# Patient Record
Sex: Male | Born: 1960 | Race: White | Hispanic: No | Marital: Married | Smoking: Former smoker
Health system: Southern US, Community
[De-identification: ages and names within clinical notes are randomized; demographics above are authoritative.]

## PROBLEM LIST (undated history)

## (undated) DIAGNOSIS — D126 Benign neoplasm of colon, unspecified: Secondary | ICD-10-CM

## (undated) DIAGNOSIS — N400 Enlarged prostate without lower urinary tract symptoms: Secondary | ICD-10-CM

## (undated) DIAGNOSIS — K5790 Diverticulosis of intestine, part unspecified, without perforation or abscess without bleeding: Secondary | ICD-10-CM

## (undated) DIAGNOSIS — N200 Calculus of kidney: Secondary | ICD-10-CM

## (undated) DIAGNOSIS — G4733 Obstructive sleep apnea (adult) (pediatric): Secondary | ICD-10-CM

## (undated) HISTORY — PX: SIGMOIDOSCOPY: SUR1295

## (undated) HISTORY — DX: Obstructive sleep apnea (adult) (pediatric): G47.33

## (undated) HISTORY — DX: Benign prostatic hyperplasia without lower urinary tract symptoms: N40.0

## (undated) HISTORY — DX: Diverticulosis of intestine, part unspecified, without perforation or abscess without bleeding: K57.90

## (undated) HISTORY — DX: Calculus of kidney: N20.0

## (undated) HISTORY — PX: TONSILLECTOMY AND ADENOIDECTOMY: SUR1326

## (undated) HISTORY — DX: Benign neoplasm of colon, unspecified: D12.6

---
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
## 1993-08-15 HISTORY — PX: UMBILICAL HERNIA REPAIR: SHX196

## 2011-06-10 ENCOUNTER — Emergency Department (HOSPITAL_COMMUNITY)
Admission: EM | Admit: 2011-06-10 | Discharge: 2011-06-10 | Disposition: A | Discharge status: Home / Self Care | Payer: 59 | Attending: Emergency Medicine | Admitting: Emergency Medicine

## 2011-06-10 ENCOUNTER — Emergency Department (HOSPITAL_COMMUNITY): Payer: 59

## 2011-06-10 DIAGNOSIS — Z79899 Other long term (current) drug therapy: Secondary | ICD-10-CM | POA: Insufficient documentation

## 2011-06-10 DIAGNOSIS — N201 Calculus of ureter: Secondary | ICD-10-CM | POA: Insufficient documentation

## 2011-06-10 DIAGNOSIS — N1330 Unspecified hydronephrosis: Secondary | ICD-10-CM | POA: Insufficient documentation

## 2011-06-10 DIAGNOSIS — R110 Nausea: Secondary | ICD-10-CM | POA: Insufficient documentation

## 2011-06-10 DIAGNOSIS — R109 Unspecified abdominal pain: Secondary | ICD-10-CM | POA: Insufficient documentation

## 2011-06-10 LAB — POCT I-STAT, CHEM 8
Chloride: 103 mEq/L (ref 96–112)
HCT: 46 % (ref 39.0–52.0)
Hemoglobin: 15.6 g/dL (ref 13.0–17.0)
Potassium: 4 mEq/L (ref 3.5–5.1)
Sodium: 140 mEq/L (ref 135–145)

## 2011-06-10 LAB — URINE MICROSCOPIC-ADD ON

## 2011-06-10 LAB — URINALYSIS, ROUTINE W REFLEX MICROSCOPIC
Leukocytes, UA: NEGATIVE
Nitrite: NEGATIVE
Specific Gravity, Urine: 1.016 (ref 1.005–1.030)
Urobilinogen, UA: 0.2 mg/dL (ref 0.0–1.0)
pH: 6.5 (ref 5.0–8.0)

## 2011-06-11 ENCOUNTER — Emergency Department (HOSPITAL_COMMUNITY)
Admission: EM | Admit: 2011-06-11 | Discharge: 2011-06-11 | Disposition: A | Discharge status: Home / Self Care | Payer: 59 | Attending: Emergency Medicine | Admitting: Emergency Medicine

## 2011-06-11 DIAGNOSIS — N200 Calculus of kidney: Secondary | ICD-10-CM | POA: Insufficient documentation

## 2011-06-11 DIAGNOSIS — R109 Unspecified abdominal pain: Secondary | ICD-10-CM | POA: Insufficient documentation

## 2011-06-11 DIAGNOSIS — R10819 Abdominal tenderness, unspecified site: Secondary | ICD-10-CM | POA: Insufficient documentation

## 2011-06-11 DIAGNOSIS — R112 Nausea with vomiting, unspecified: Secondary | ICD-10-CM | POA: Insufficient documentation

## 2011-06-11 LAB — DIFFERENTIAL
Basophils Relative: 0 % (ref 0–1)
Eosinophils Absolute: 0 10*3/uL (ref 0.0–0.7)
Lymphs Abs: 1.1 10*3/uL (ref 0.7–4.0)
Monocytes Absolute: 1.8 10*3/uL — ABNORMAL HIGH (ref 0.1–1.0)
Monocytes Relative: 11 % (ref 3–12)

## 2011-06-11 LAB — CBC
Hemoglobin: 15.8 g/dL (ref 13.0–17.0)
MCH: 28.7 pg (ref 26.0–34.0)
MCHC: 35 g/dL (ref 30.0–36.0)
MCV: 82 fL (ref 78.0–100.0)
Platelets: 244 10*3/uL (ref 150–400)
RBC: 5.5 MIL/uL (ref 4.22–5.81)

## 2011-06-11 LAB — BASIC METABOLIC PANEL
CO2: 22 mEq/L (ref 19–32)
Calcium: 9.3 mg/dL (ref 8.4–10.5)
Creatinine, Ser: 1.67 mg/dL — ABNORMAL HIGH (ref 0.50–1.35)
GFR calc non Af Amer: 46 mL/min — ABNORMAL LOW (ref >90)
Glucose, Bld: 108 mg/dL — ABNORMAL HIGH (ref 70–99)
Sodium: 134 mEq/L — ABNORMAL LOW (ref 135–145)

## 2011-06-11 LAB — URINE CULTURE
Colony Count: NO GROWTH
Culture  Setup Time: 201210260519

## 2011-06-11 LAB — URINALYSIS, ROUTINE W REFLEX MICROSCOPIC
Glucose, UA: NEGATIVE mg/dL
Ketones, ur: 15 mg/dL — AB
Protein, ur: 30 mg/dL — AB
Urobilinogen, UA: 0.2 mg/dL (ref 0.0–1.0)

## 2011-06-11 LAB — URINE MICROSCOPIC-ADD ON

## 2011-09-14 DIAGNOSIS — N200 Calculus of kidney: Secondary | ICD-10-CM | POA: Insufficient documentation

## 2011-09-14 DIAGNOSIS — N400 Enlarged prostate without lower urinary tract symptoms: Secondary | ICD-10-CM | POA: Insufficient documentation

## 2012-03-07 ENCOUNTER — Encounter: Payer: Self-pay | Admitting: Internal Medicine

## 2012-04-04 ENCOUNTER — Ambulatory Visit (AMBULATORY_SURGERY_CENTER): Payer: 59 | Admitting: *Deleted

## 2012-04-04 VITALS — Ht 74.0 in | Wt 266.3 lb

## 2012-04-04 DIAGNOSIS — Z1211 Encounter for screening for malignant neoplasm of colon: Secondary | ICD-10-CM

## 2012-04-04 MED ORDER — NA SULFATE-K SULFATE-MG SULF 17.5-3.13-1.6 GM/177ML PO SOLN: 1.0000 | Freq: Once | ORAL | Status: DC

## 2012-04-04 NOTE — Progress Notes (Signed)
Pt stated at his pre visit that with prior anesthesia his " insides froze up" and that he had a lot of GI pain/problems after the procedures. Pt stated that he has 2 umbilical hernia repairs and that they were laproscopic in nature.  Pt stated he has lots of gas and that his insides just didn't work for several days after the procedures. He was concerned about the sedation he would receive here with his colonoscopy. Pt informed and instructed on deep sedation with the colonoscopy he would have here, was told not general anesthesia and he would be able to discuss this concern with the CRNA on the day of the procedure. Pt stated after discussion he was comfortable with the deep sedation he would receive. ewm

## 2012-04-06 ENCOUNTER — Encounter: Payer: Self-pay | Admitting: Internal Medicine

## 2012-04-18 ENCOUNTER — Ambulatory Visit (AMBULATORY_SURGERY_CENTER): Payer: 59 | Admitting: Internal Medicine

## 2012-04-18 ENCOUNTER — Encounter: Payer: Self-pay | Admitting: Internal Medicine

## 2012-04-18 VITALS — BP 112/75 | HR 69 | Temp 97.2°F | Resp 18 | Ht 74.0 in | Wt 266.0 lb

## 2012-04-18 DIAGNOSIS — D126 Benign neoplasm of colon, unspecified: Secondary | ICD-10-CM

## 2012-04-18 DIAGNOSIS — Z1211 Encounter for screening for malignant neoplasm of colon: Secondary | ICD-10-CM

## 2012-04-18 DIAGNOSIS — D1330 Benign neoplasm of unspecified part of small intestine: Secondary | ICD-10-CM

## 2012-04-18 MED ORDER — SODIUM CHLORIDE 0.9 % IV SOLN: 500.0000 mL | INTRAVENOUS | Status: DC

## 2012-04-18 NOTE — Patient Instructions (Addendum)

## 2012-04-18 NOTE — Progress Notes (Signed)
Patient did not experience any of the following events: a burn prior to discharge; a fall within the facility; wrong site/side/patient/procedure/implant event; or a hospital transfer or hospital admission upon discharge from the facility. (G8907) Patient did not have preoperative order for IV antibiotic SSI prophylaxis. (G8918)  

## 2012-04-18 NOTE — Op Note (Signed)
[facility] [address] [place], [address]   COLONOSCOPY PROCEDURE REPORT  PATIENT: Welton, Bord  MR#: [id number] BIRTHDATE: 1961/07/08 , 51  yrs. old GENDER: Male ENDOSCOPIST: Beverley Fiedler, MD REFERRED BY: PROCEDURE DATE:  04/18/2012 PROCEDURE:   Colonoscopy with biopsy, Colonoscopy with cold biopsy polypectomy, and Colonoscopy with snare polypectomy ASA CLASS:   Class II INDICATIONS:average risk screening and first colonoscopy. MEDICATIONS: MAC sedation, administered by CRNA and propofol (Diprivan) 300mg  IV  DESCRIPTION OF PROCEDURE:   After the risks benefits and alternatives of the procedure were thoroughly explained, informed consent was obtained.  A digital rectal exam revealed no rectal mass.   The LB CF-H180AL [id number]  endoscope was introduced through the anus and advanced to the terminal ileum which was intubated for a short distance. No adverse events experienced.   The quality of the prep was Suprep good  The instrument was then slowly withdrawn as the colon was fully examined.      COLON FINDINGS: The mucosa appeared normal in the terminal ileum. A possible polyp was found at the ileocecal valve.  This was located behind the valve.  Multiple biopsies were performed to rule out adenoma.   Two sessile polyps ranging between 3-20mm in size were found in the transverse colon.  A polypectomy was performed with cold forceps.  The resection was complete and the polyp tissue was completely retrieved.   A semi-pedunculated polyp measuring 5 mm in size was found in the transverse colon.  A polypectomy was performed with a cold snare.  The resection was complete and the polyp tissue was completely retrieved.   Three sessile polyps ranging between 3-29mm in size were found in the rectosigmoid colon. A polypectomy was performed with cold forceps.  The resection was complete and the polyp tissue was completely retrieved.   Mild diverticulosis was noted in  the sigmoid colon.  Retroflexed views revealed internal hemorrhoids. The time to cecum=1 minutes 57 seconds.  Withdrawal time=17 minutes 36 seconds.  The scope was withdrawn and the procedure completed. COMPLICATIONS: There were no complications.  ENDOSCOPIC IMPRESSION: 1.   Normal mucosa in the terminal ileum 2.   Sessile polyp was found at the ileocecal valve; multiple biopsies were performed 3.   Two sessile polyps ranging between 3-53mm in size were found in the transverse colon; polypectomy was performed with cold forceps 4.   Semi-pedunculated polyp measuring 5 mm in size was found in the transverse colon; polypectomy was performed with a cold snare 5.   Three sessile polyps ranging between 3-73mm in size were found in the rectosigmoid colon; polypectomy was performed with cold forceps 6.   Mild diverticulosis was noted in the sigmoid colon  RECOMMENDATIONS: 1.  Hold aspirin, aspirin products, and anti-inflammatory medication for 1 week. 2.  await pathology results 3.  High fiber diet 4.  If the polyps removed today are proven to be adenomatous (pre-cancerous) polyps, you will need a colonoscopy in 3 years. Otherwise you should continue to follow colorectal cancer screening guidelines for "routine risk" patients with a colonoscopy in 10 years.  You will receive a letter within 1-2 weeks with the results of your biopsy as well as final recommendations.  Please call my office if you have not received a letter after 3 weeks.   eSigned:  Beverley Fiedler, MD 04/18/2012 9:18 AM   cc: The Patient   PATIENT NAME:  Edi, Gorniak MR#: [id number]

## 2012-04-19 ENCOUNTER — Telehealth: Payer: Self-pay | Admitting: *Deleted

## 2012-04-19 NOTE — Telephone Encounter (Signed)
  Follow up Call-  Call back number 04/18/2012  Post procedure Call Back phone  # [phone]  Permission to leave phone message Yes     Patient questions:  Do you have a fever, pain , or abdominal swelling? no Pain Score  0 *  Have you tolerated food without any problems? yes  Have you been able to return to your normal activities? yes  Do you have any questions about your discharge instructions: Diet   no Medications  no Follow up visit  no  Do you have questions or concerns about your Care? no  Actions: * If pain score is 4 or above: No action needed, pain <4.

## 2012-04-23 ENCOUNTER — Encounter: Payer: Self-pay | Admitting: Internal Medicine

## 2013-07-02 ENCOUNTER — Encounter: Payer: Self-pay | Admitting: Family Medicine

## 2013-07-02 ENCOUNTER — Ambulatory Visit (INDEPENDENT_AMBULATORY_CARE_PROVIDER_SITE_OTHER): Payer: 59 | Admitting: Family Medicine

## 2013-07-02 VITALS — BP 110/78 | HR 100 | Temp 98.1°F | Resp 20 | Ht 72.0 in | Wt 288.0 lb

## 2013-07-02 DIAGNOSIS — E669 Obesity, unspecified: Secondary | ICD-10-CM

## 2013-07-02 DIAGNOSIS — R000 Tachycardia, unspecified: Secondary | ICD-10-CM

## 2013-07-02 DIAGNOSIS — Z23 Encounter for immunization: Secondary | ICD-10-CM

## 2013-07-02 DIAGNOSIS — J189 Pneumonia, unspecified organism: Secondary | ICD-10-CM

## 2013-07-02 DIAGNOSIS — Z0000 Encounter for general adult medical examination without abnormal findings: Secondary | ICD-10-CM

## 2013-07-02 NOTE — Assessment & Plan Note (Signed)
He has a mild resting tachycardia. He has been using a significant amount of albuterol which may be contributing as well his recent prednisone use. His EKG was reassuring.

## 2013-07-02 NOTE — Patient Instructions (Signed)
Chest xray to be done Come back for fasting labs  Flu and Pneumonia vaccine given Next colonoscopy in 2016  Work on weight loss I recommend eye visit once a year I recommend dental visit every 6 months Goal is to  Exercise 30 minutes 5 days a week F/U 1 year

## 2013-07-02 NOTE — Progress Notes (Signed)
  Subjective:    Patient ID: Cory Reynolds, male    DOB: Jun 23, 1961, 52 y.o.   MRN: [id number]  HPI  Patient here to for annual physical exam. He was treated for pneumonia 4 weeks ago in urgent care he was prescribed Levaquin prednisone and albuterol inhaler. He is due for repeat chest x-ray. His cough has improved. He does occasionally use the albuterol. He's had 3 episodes of pneumonia all requiring treatment.  He has history of sleep apnea however he lost 30 pounds does not use his sleep apnea machine in quite some time.  Colonoscopy is up to date he did have some polyps therefore he needs a repeat colonoscopy in 20 16th  His history of BPH and is followed by urology he's currently on medications for BPH as well as recurrent kidney stones. He's had a PSA as well as rectal exam done within the past year.    Review of Systems   GEN- denies fatigue, fever, weight loss,weakness, recent illness HEENT- denies eye drainage, change in vision, nasal discharge, CVS- denies chest pain, palpitations RESP- denies SOB, cough, wheeze ABD- denies N/V, change in stools, abd pain GU- denies dysuria, hematuria, dribbling, incontinence MSK- denies joint pain, muscle aches, injury Neuro- denies headache, dizziness, syncope, seizure activity      Objective:   Physical Exam GEN- NAD, alert and oriented x3 HEENT- PERRL, EOMI, non injected sclera, pink conjunctiva, MMM, oropharynx clear Neck- Supple, no thryomegaly CVS- Mild tachycardia- HR 96, no murmur RESP-CTAB ABD-NABS,soft,NT,ND GU-Deferred EXT- No edema Pulses- Radial, DP- 2+  EKG- NSR, HR 86         Assessment & Plan:   CPE- physical complete. Fasting labs to be done. Flu shot and pneumonia vaccine given secondary to his recurrent pneumonia

## 2013-07-02 NOTE — Assessment & Plan Note (Signed)
Will repeat chest x-ray he will have this done at the [facility] imaging center out in [facility]

## 2013-07-04 ENCOUNTER — Other Ambulatory Visit: Payer: 59

## 2013-07-04 DIAGNOSIS — Z0000 Encounter for general adult medical examination without abnormal findings: Secondary | ICD-10-CM

## 2013-07-04 DIAGNOSIS — E669 Obesity, unspecified: Secondary | ICD-10-CM

## 2013-07-04 LAB — COMPREHENSIVE METABOLIC PANEL
Alkaline Phosphatase: 62 U/L (ref 39–117)
BUN: 22 mg/dL (ref 6–23)
CO2: 26 mEq/L (ref 19–32)
Creat: 1.05 mg/dL (ref 0.50–1.35)
Glucose, Bld: 102 mg/dL — ABNORMAL HIGH (ref 70–99)
Sodium: 139 mEq/L (ref 135–145)
Total Bilirubin: 0.5 mg/dL (ref 0.3–1.2)
Total Protein: 6.1 g/dL (ref 6.0–8.3)

## 2013-07-04 LAB — LIPID PANEL
Cholesterol: 196 mg/dL (ref 0–200)
HDL: 38 mg/dL — ABNORMAL LOW (ref >39)
Total CHOL/HDL Ratio: 5.2 Ratio
VLDL: 26 mg/dL (ref 0–40)

## 2013-07-04 LAB — CBC WITH DIFFERENTIAL/PLATELET
Eosinophils Relative: 6 % — ABNORMAL HIGH (ref 0–5)
Hemoglobin: 14.7 g/dL (ref 13.0–17.0)
Lymphocytes Relative: 22 % (ref 12–46)
Lymphs Abs: 1.1 10*3/uL (ref 0.7–4.0)
MCV: 82.6 fL (ref 78.0–100.0)
Monocytes Relative: 15 % — ABNORMAL HIGH (ref 3–12)
Platelets: 207 10*3/uL (ref 150–400)
RBC: 5.16 MIL/uL (ref 4.22–5.81)
WBC: 5 10*3/uL (ref 4.0–10.5)

## 2013-07-08 ENCOUNTER — Encounter: Payer: Self-pay | Admitting: *Deleted

## 2013-07-18 ENCOUNTER — Encounter: Payer: Self-pay | Admitting: Family Medicine

## 2014-05-28 ENCOUNTER — Ambulatory Visit: Payer: 59 | Admitting: Family Medicine

## 2015-04-13 ENCOUNTER — Encounter: Payer: Self-pay | Admitting: Internal Medicine

## 2015-05-18 ENCOUNTER — Encounter: Payer: Self-pay | Admitting: Internal Medicine

## 2015-07-14 ENCOUNTER — Ambulatory Visit (AMBULATORY_SURGERY_CENTER): Payer: Self-pay

## 2015-07-14 VITALS — Ht 74.0 in | Wt 272.8 lb

## 2015-07-14 DIAGNOSIS — Z86010 Personal history of colonic polyps: Secondary | ICD-10-CM

## 2015-07-14 MED ORDER — NA SULFATE-K SULFATE-MG SULF 17.5-3.13-1.6 GM/177ML PO SOLN: ORAL | Status: DC

## 2015-07-14 NOTE — Progress Notes (Signed)
Per pt, no allergies to soy or egg products.Pt not taking any weight loss meds or using  O2 at home. 

## 2015-07-24 ENCOUNTER — Encounter: Payer: Self-pay | Admitting: Internal Medicine

## 2015-07-24 ENCOUNTER — Ambulatory Visit (AMBULATORY_SURGERY_CENTER): Payer: BLUE CROSS/BLUE SHIELD | Admitting: Internal Medicine

## 2015-07-24 VITALS — BP 131/94 | HR 71 | Temp 97.6°F | Resp 20 | Ht 74.0 in | Wt 272.0 lb

## 2015-07-24 DIAGNOSIS — D122 Benign neoplasm of ascending colon: Secondary | ICD-10-CM | POA: Diagnosis not present

## 2015-07-24 DIAGNOSIS — D120 Benign neoplasm of cecum: Secondary | ICD-10-CM | POA: Diagnosis not present

## 2015-07-24 DIAGNOSIS — Z86010 Personal history of colonic polyps: Secondary | ICD-10-CM

## 2015-07-24 DIAGNOSIS — D128 Benign neoplasm of rectum: Secondary | ICD-10-CM

## 2015-07-24 DIAGNOSIS — D129 Benign neoplasm of anus and anal canal: Secondary | ICD-10-CM

## 2015-07-24 MED ORDER — SODIUM CHLORIDE 0.9 % IV SOLN: 500.0000 mL | INTRAVENOUS | Status: DC

## 2015-07-24 NOTE — Op Note (Signed)
[facility]  [address] [place], [address]   COLONOSCOPY PROCEDURE REPORT  PATIENT: Cory, Reynolds  MR#: [id number] BIRTHDATE: 1961/05/18 , 30  yrs. old GENDER: male ENDOSCOPIST: Jerene Bears, MD PROCEDURE DATE:  07/24/2015 PROCEDURE:   Colonoscopy, surveillance and Colonoscopy with snare polypectomy First Screening Colonoscopy - Avg.  risk and is 50 yrs.  old or older - No.  Prior Negative Screening - Now for repeat screening. N/A  History of Adenoma - Now for follow-up colonoscopy & has been > or = to 3 yrs.  N/A  Polyps removed today? Yes ASA CLASS:   Class II INDICATIONS:Surveillance due to prior colonic neoplasia, PH Colon Adenoma, and last colon 2013. MEDICATIONS: Monitored anesthesia care, Propofol 400 mg IV, and Lidocaine 40 mg IV  DESCRIPTION OF PROCEDURE:   After the risks benefits and alternatives of the procedure were thoroughly explained, informed consent was obtained.  The digital rectal exam revealed no rectal mass.   The LB PFC-H190 [id number]  endoscope was introduced through the anus and advanced to the cecum, which was identified by both the appendix and ileocecal valve. No adverse events experienced. The quality of the prep was good.  (Suprep was used)  The instrument was then slowly withdrawn as the colon was fully examined. Estimated blood loss is zero unless otherwise noted in this procedure report.  Due to processor error pictures were taken but not captured.     COLON FINDINGS: Three sessile polyps ranging between 3-81mm in size were found at the cecum, in the ascending colon, and rectum. Polypectomies were performed with a cold snare.  The resection was complete, the polyp tissue was completely retrieved and sent to histology.   There was mild diverticulosis noted in the left colon. Retroflexed views revealed internal hemorrhoids. The time to cecum = 3.1 Withdrawal time = 15.5   The scope was withdrawn and the procedure  completed.  COMPLICATIONS: There were no immediate complications.  ENDOSCOPIC IMPRESSION: 1.   Three sessile polyps ranging between 3-68mm in size were found at the cecum, in the ascending colon, and rectum; polypectomies were performed with a cold snare 2.   Mild diverticulosis was noted in the left colon  RECOMMENDATIONS: 1.  Await pathology results 2.  High fiber diet 3.  Timing of repeat colonoscopy will be determined by pathology findings. 4.  You will receive a letter within 1-2 weeks with the results of your biopsy as well as final recommendations.  Please call my office if you have not received a letter after 3 weeks.  eSigned:  Jerene Bears, MD 07/24/2015 8:36 AM   cc: [name] MD, the patient

## 2015-07-24 NOTE — Progress Notes (Signed)
Called to room to assist during endoscopic procedure.  Patient ID and intended procedure confirmed with present staff. Received instructions for my participation in the procedure from the performing physician.  

## 2015-07-24 NOTE — Patient Instructions (Signed)
YOU HAD AN ENDOSCOPIC PROCEDURE TODAY AT [facility]:   Refer to the procedure report that was given to you for any specific questions about what was found during the examination.  If the procedure report does not answer your questions, please call your gastroenterologist to clarify.  If you requested that your care partner not be given the details of your procedure findings, then the procedure report has been included in a sealed envelope for you to review at your convenience later.  YOU SHOULD EXPECT: Some feelings of bloating in the abdomen. Passage of more gas than usual.  Walking can help get rid of the air that was put into your GI tract during the procedure and reduce the bloating. If you had a lower endoscopy (such as a colonoscopy or flexible sigmoidoscopy) you may notice spotting of blood in your stool or on the toilet paper. If you underwent a bowel prep for your procedure, you may not have a normal bowel movement for a few days.  Please Note:  You might notice some irritation and congestion in your nose or some drainage.  This is from the oxygen used during your procedure.  There is no need for concern and it should clear up in a day or so.  SYMPTOMS TO REPORT IMMEDIATELY:   Following lower endoscopy (colonoscopy or flexible sigmoidoscopy):  Excessive amounts of blood in the stool  Significant tenderness or worsening of abdominal pains  Swelling of the abdomen that is new, acute  Fever of 100F or higher  For urgent or emergent issues, a gastroenterologist can be reached at any hour by calling [phone].   DIET: Your first meal following the procedure should be a small meal and then it is ok to progress to your normal diet. Heavy or fried foods are harder to digest and may make you feel nauseous or bloated.  Likewise, meals heavy in dairy and vegetables can increase bloating.  Drink plenty of fluids but you should avoid alcoholic beverages for 24  hours.  ACTIVITY:  You should plan to take it easy for the rest of today and you should NOT DRIVE or use heavy machinery until tomorrow (because of the sedation medicines used during the test).    FOLLOW UP: Our staff will call the number listed on your records the next business day following your procedure to check on you and address any questions or concerns that you may have regarding the information given to you following your procedure. If we do not reach you, we will leave a message.  However, if you are feeling well and you are not experiencing any problems, there is no need to return our call.  We will assume that you have returned to your regular daily activities without incident.  If any biopsies were taken you will be contacted by phone or by letter within the next 1-3 weeks.  Please call us at [phone] if you have not heard about the biopsies in 3 weeks.    SIGNATURES/CONFIDENTIALITY: You and/or your care partner have signed paperwork which will be entered into your electronic medical record.  These signatures attest to the fact that that the information above on your After Visit Summary has been reviewed and is understood.  Full responsibility of the confidentiality of this discharge information lies with you and/or your care-partner.  Next colonoscopy determined by pathology results. Please review polyp, diverticulosis, hemorrhoid, and high fiber diet handouts provided.

## 2015-07-24 NOTE — Progress Notes (Signed)
Stable to RR 

## 2015-07-27 ENCOUNTER — Telehealth: Payer: Self-pay | Admitting: *Deleted

## 2015-07-27 NOTE — Telephone Encounter (Signed)
  Follow up Call-  Call back number 07/24/2015  Post procedure Call Back phone  # [phone]  Permission to leave phone message Yes    Pt gone, spoke with wife Patient questions:  Do you have a fever, pain , or abdominal swelling? No. Pain Score  0 *  Have you tolerated food without any problems? Yes.    Have you been able to return to your normal activities? Yes.    Do you have any questions about your discharge instructions: Diet   No. Medications  No. Follow up visit  No.  Do you have questions or concerns about your Care? No.  Actions: * If pain score is 4 or above: No action needed, pain <4.

## 2015-07-28 ENCOUNTER — Encounter: Payer: Self-pay | Admitting: Internal Medicine

## 2016-02-24 ENCOUNTER — Encounter: Payer: Self-pay | Admitting: Family Medicine

## 2016-02-24 ENCOUNTER — Ambulatory Visit (INDEPENDENT_AMBULATORY_CARE_PROVIDER_SITE_OTHER): Payer: BLUE CROSS/BLUE SHIELD | Admitting: Family Medicine

## 2016-02-24 VITALS — BP 139/93 | HR 73 | Ht 74.0 in | Wt 299.0 lb

## 2016-02-24 DIAGNOSIS — N400 Enlarged prostate without lower urinary tract symptoms: Secondary | ICD-10-CM

## 2016-02-24 DIAGNOSIS — N200 Calculus of kidney: Secondary | ICD-10-CM | POA: Diagnosis not present

## 2016-02-24 DIAGNOSIS — G4733 Obstructive sleep apnea (adult) (pediatric): Secondary | ICD-10-CM

## 2016-02-24 DIAGNOSIS — I10 Essential (primary) hypertension: Secondary | ICD-10-CM | POA: Diagnosis not present

## 2016-02-24 DIAGNOSIS — Z23 Encounter for immunization: Secondary | ICD-10-CM

## 2016-02-24 MED ORDER — ZOSTER VACCINE LIVE 19400 UNT/0.65ML ~~LOC~~ SUSR: 0.6500 mL | Freq: Once | SUBCUTANEOUS | Status: DC

## 2016-02-24 NOTE — Patient Instructions (Addendum)
Thank you for coming in today. Get fasting labs soon.  You should hear from [place] [facility] soon.  Your pharmacy should have a shingles vaccine waiting for you.  Return in 6-12 months or sooner if needed.  Resume use of CPAP.    Call or go to the emergency room if you get worse, have trouble breathing, have chest pains, or palpitations.

## 2016-02-24 NOTE — Progress Notes (Signed)
Cory Reynolds is a 55 y.o. male who presents to [facility] today for establish care and discuss obesity, history of BPH and kidney stones, and history of recurrent pneumonia..  Patient has struggled with his weight has an entire adult life. In the past he was able to lose 50 pounds with diet weight loss exercise and phentermine. He's regained the weight since. He continues to try to diet and exercise but really struggles. He is interested in bariatric surgery. He thinks his obesity is causing most of his health complaints including elevated blood pressure and sleep apnea.  Additionally patient has a history of recurrent pneumonia. In the past she's been told that he should have a pneumonia shot but never got it. He cannot also recall the last time he had a tetanus vaccine.   Past Medical History  Diagnosis Date  . BPH (benign prostatic hyperplasia)   . OSA (obstructive sleep apnea)   . Kidney stones     followed by urology   Past Surgical History  Procedure Laterality Date  . Umbilical hernia repair  1995    x2  . Sigmoidoscopy    . Lymph node biopsy  1978    negative  . Tonsillectomy and adenoidectomy     Social History  Substance Use Topics  . Smoking status: Former Smoker    Types: Cigarettes    Quit date: 09/17/1983  . Smokeless tobacco: Never Used  . Alcohol Use: No   family history includes Heart disease in his father. There is no history of Colon cancer, Rectal cancer, or Stomach cancer.  ROS as above: No headache, visual changes, nausea, vomiting, diarrhea, constipation, dizziness, abdominal pain, skin rash, fevers, chills, night sweats, weight loss, swollen lymph nodes, body aches, joint swelling, muscle aches, chest pain, shortness of breath, mood changes, visual or auditory hallucinations.    Medications: Current Outpatient Prescriptions    Medication Sig Dispense Refill  . allopurinol (ZYLOPRIM) 300 MG tablet Take 300 mg by mouth daily.     Marland Kitchen aspirin 81 MG tablet Take 81 mg by mouth daily.    . hydrochlorothiazide (MICROZIDE) 12.5 MG capsule Take 12.5 mg by mouth daily.     . Magnesium 400 MG TABS Take by mouth daily.    . Tamsulosin HCl (FLOMAX) 0.4 MG CAPS 0.4 mg daily.     Marland Kitchen Zoster Vaccine Live, PF, (ZOSTAVAX) 16109 UNT/0.65ML injection Inject 19,400 Units into the skin once. If given in pharmacy fax report to Dr Georgina Snell [phone] 1 each 0   No current facility-administered medications for this visit.   No Known Allergies   Exam:  BP 139/93 mmHg  Pulse 73  Ht 6\' 2"  (1.88 m)  Wt 299 lb (135.626 kg)  BMI 38.37 kg/m2 Gen: Well NAD Morbidly obesity HEENT: EOMI,  MMM normal posterior pharynx Lungs: Normal work of breathing. CTABL Heart: RRR no MRG Abd: NABS, Soft. Nondistended, Nontender Exts: Brisk capillary refill, warm and well perfused.   Tdap and Pneumovax given prior to discharge  No results found for this or any previous visit (from the past 24 hour(s)). No results found.    Assessment and Plan: 55 y.o. male with   Morbid obesity: Patient is a BMI over 25 with obesity related health problems including hypertension and sleep apnea. We had a long discussion about his options. He would like to have referral to bariatric surgery for a evaluation and to learn more about his options.  I think this is entirely reasonable. Referral order.  Hypertension: Patient takes hydrochlorothiazide and continues to have mildly elevated blood pressure. We'll obtain basic fasting labs.  Sleep apnea: Not currently using CPAP machine. Recommend patient use a seat at machine since he is regaining the weight. He will attempt to find a record of his previous sleep study so that we may order more supplies if he needs it.  Vaccines: Tdap and Pneumovax given prior to discharge. Shingles vaccine prescribed pharmacy.  Discussed  warning signs or symptoms. Please see discharge instructions. Patient expresses understanding.

## 2016-03-04 LAB — CBC
HCT: 45.4 % (ref 38.5–50.0)
HEMOGLOBIN: 15.1 g/dL (ref 13.2–17.1)
MCH: 28.1 pg (ref 27.0–33.0)
MCHC: 33.3 g/dL (ref 32.0–36.0)
MCV: 84.5 fL (ref 80.0–100.0)
MPV: 10.3 fL (ref 7.5–12.5)
PLATELETS: 252 10*3/uL (ref 140–400)
RBC: 5.37 MIL/uL (ref 4.20–5.80)
RDW: 14.2 % (ref 11.0–15.0)
WBC: 5.6 10*3/uL (ref 3.8–10.8)

## 2016-03-05 LAB — COMPREHENSIVE METABOLIC PANEL
ALBUMIN: 4 g/dL (ref 3.6–5.1)
ALK PHOS: 56 U/L (ref 40–115)
ALT: 18 U/L (ref 9–46)
AST: 17 U/L (ref 10–35)
BILIRUBIN TOTAL: 0.5 mg/dL (ref 0.2–1.2)
BUN: 23 mg/dL (ref 7–25)
CO2: 26 mmol/L (ref 20–31)
CREATININE: 1.03 mg/dL (ref 0.70–1.33)
Calcium: 9 mg/dL (ref 8.6–10.3)
Chloride: 103 mmol/L (ref 98–110)
Glucose, Bld: 100 mg/dL — ABNORMAL HIGH (ref 65–99)
Potassium: 4.4 mmol/L (ref 3.5–5.3)
SODIUM: 142 mmol/L (ref 135–146)
TOTAL PROTEIN: 6.1 g/dL (ref 6.1–8.1)

## 2016-03-05 LAB — LIPID PANEL
Cholesterol: 176 mg/dL (ref 125–200)
HDL: 39 mg/dL — AB (ref >=40)
LDL CALC: 113 mg/dL (ref <130)
Total CHOL/HDL Ratio: 4.5 Ratio (ref <=5.0)
Triglycerides: 120 mg/dL (ref <150)
VLDL: 24 mg/dL (ref <30)

## 2016-03-05 LAB — VITAMIN D 25 HYDROXY (VIT D DEFICIENCY, FRACTURES): VIT D 25 HYDROXY: 29 ng/mL — AB (ref 30–100)

## 2016-03-05 LAB — HEMOGLOBIN A1C
HEMOGLOBIN A1C: 6 % — AB (ref <5.7)
MEAN PLASMA GLUCOSE: 126 mg/dL

## 2016-03-05 LAB — TESTOSTERONE: TESTOSTERONE: 369 ng/dL (ref 250–827)

## 2016-03-05 LAB — TSH: TSH: 1.09 m[IU]/L (ref 0.40–4.50)

## 2016-03-05 LAB — URIC ACID: URIC ACID, SERUM: 4.5 mg/dL (ref 4.0–8.0)

## 2016-03-07 ENCOUNTER — Encounter: Payer: Self-pay | Admitting: Family Medicine

## 2016-03-07 DIAGNOSIS — R7303 Prediabetes: Secondary | ICD-10-CM | POA: Insufficient documentation

## 2016-03-07 DIAGNOSIS — E559 Vitamin D deficiency, unspecified: Secondary | ICD-10-CM | POA: Insufficient documentation

## 2016-03-21 ENCOUNTER — Other Ambulatory Visit: Payer: Self-pay | Admitting: General Surgery

## 2016-03-21 ENCOUNTER — Other Ambulatory Visit (HOSPITAL_COMMUNITY): Payer: Self-pay | Admitting: General Surgery

## 2016-03-30 ENCOUNTER — Ambulatory Visit
Admission: RE | Admit: 2016-03-30 | Discharge: 2016-03-30 | Disposition: A | Discharge status: Home / Self Care | Payer: BLUE CROSS/BLUE SHIELD | Source: Ambulatory Visit | Attending: General Surgery | Admitting: General Surgery

## 2016-03-30 ENCOUNTER — Other Ambulatory Visit: Payer: Self-pay

## 2016-04-07 ENCOUNTER — Encounter: Payer: BLUE CROSS/BLUE SHIELD | Attending: General Surgery | Admitting: Skilled Nursing Facility1

## 2016-04-07 ENCOUNTER — Encounter: Payer: Self-pay | Admitting: Skilled Nursing Facility1

## 2016-04-07 DIAGNOSIS — Z713 Dietary counseling and surveillance: Secondary | ICD-10-CM | POA: Diagnosis not present

## 2016-04-07 NOTE — Progress Notes (Signed)
  Pre-Op Assessment Visit:  Pre-Operative sleeve gastrectomy Surgery  Medical Nutrition Therapy:  Appt start time: 1500   End time:  1600.  Patient was seen on 04/07/2016 for Pre-Operative Nutrition Assessment. Assessment and letter of approval faxed to [facility] Bariatric Surgery Program coordinator on 04/07/2016.  Pt states he was hypoglycemia-85. Pt states he will be athletic again  Preferred Learning Style:   No preference indicated   Learning Readiness:   Change in progress  Handouts given during visit include:  Pre-Op Goals Bariatric Surgery Protein Shakes  During the appointment today the following Pre-Op Goals were reviewed with the patient: Maintain or lose weight as instructed by your surgeon Make healthy food choices Begin to limit portion sizes Limited concentrated sugars and fried foods Keep fat/sugar in the single digits per serving on   food labels Practice CHEWING your food  (aim for 30 chews per bite or until applesauce consistency) Practice not drinking 15 minutes before, during, and 30 minutes after each meal/snack Avoid all carbonated beverages  Avoid/limit caffeinated beverages  Avoid all sugar-sweetened beverages Consume 3 meals per day; eat every 3-5 hours Make a list of non-food related activities Aim for 64-100 ounces of FLUID daily  Aim for at least 60-80 grams of PROTEIN daily Look for a liquid protein source that contain ?15 g protein and ?5 g carbohydrate  (ex: shakes, drinks, shots)  Patient-Centered Goals: 10/10 specific/non-scale and confidence/importance scale 1-10  Demonstrated degree of understanding via:  Teach Back  Teaching Method Utilized:  Visual Auditory Hands on  Barriers to learning/adherence to lifestyle change: none identified  [facility] to [facility] the [facility] to enroll in Pre-Op and Post-Op Nutrition Education when surgery date is scheduled.

## 2016-04-08 ENCOUNTER — Encounter: Payer: Self-pay | Admitting: Skilled Nursing Facility1

## 2016-04-29 ENCOUNTER — Ambulatory Visit: Payer: BLUE CROSS/BLUE SHIELD | Admitting: Skilled Nursing Facility1

## 2016-07-27 DIAGNOSIS — R509 Fever, unspecified: Secondary | ICD-10-CM | POA: Diagnosis not present

## 2016-07-27 DIAGNOSIS — J0100 Acute maxillary sinusitis, unspecified: Secondary | ICD-10-CM | POA: Diagnosis not present

## 2016-07-27 DIAGNOSIS — J209 Acute bronchitis, unspecified: Secondary | ICD-10-CM | POA: Diagnosis not present

## 2016-09-09 DIAGNOSIS — M71349 Other bursal cyst, unspecified hand: Secondary | ICD-10-CM | POA: Diagnosis not present

## 2016-09-09 DIAGNOSIS — L853 Xerosis cutis: Secondary | ICD-10-CM | POA: Diagnosis not present

## 2016-09-09 DIAGNOSIS — D225 Melanocytic nevi of trunk: Secondary | ICD-10-CM | POA: Diagnosis not present

## 2016-09-09 DIAGNOSIS — L821 Other seborrheic keratosis: Secondary | ICD-10-CM | POA: Diagnosis not present

## 2016-09-12 DIAGNOSIS — Z87442 Personal history of urinary calculi: Secondary | ICD-10-CM | POA: Diagnosis not present

## 2016-11-07 DIAGNOSIS — N400 Enlarged prostate without lower urinary tract symptoms: Secondary | ICD-10-CM | POA: Diagnosis not present

## 2016-11-21 ENCOUNTER — Encounter: Payer: BLUE CROSS/BLUE SHIELD | Attending: General Surgery | Admitting: Skilled Nursing Facility1

## 2016-11-21 DIAGNOSIS — Z713 Dietary counseling and surveillance: Secondary | ICD-10-CM | POA: Insufficient documentation

## 2016-11-21 DIAGNOSIS — Z6841 Body Mass Index (BMI) 40.0 and over, adult: Secondary | ICD-10-CM | POA: Insufficient documentation

## 2016-11-22 DIAGNOSIS — N400 Enlarged prostate without lower urinary tract symptoms: Secondary | ICD-10-CM | POA: Diagnosis not present

## 2016-11-22 DIAGNOSIS — N529 Male erectile dysfunction, unspecified: Secondary | ICD-10-CM | POA: Insufficient documentation

## 2016-11-22 DIAGNOSIS — N200 Calculus of kidney: Secondary | ICD-10-CM | POA: Diagnosis not present

## 2016-11-23 ENCOUNTER — Encounter: Payer: Self-pay | Admitting: Skilled Nursing Facility1

## 2016-11-23 NOTE — Progress Notes (Signed)
  Pre-Operative Nutrition Class:  Appt start time: 2297   End time:  1830.  Patient was seen on 11/21/2016 for Pre-Operative Bariatric Surgery Education at the [facility].   Surgery date: 12/06/2016 Surgery type: Sleeve Gastrectomy  Start weight at [facility]: 300 lbs Weight today: 307.2lbs  TANITA  BODY COMP RESULTS     BMI (kg/m^2)    Fat Mass (lbs)    Fat Free Mass (lbs)    Total Body Water (lbs)    Samples given per MNT protocol. Patient educated on appropriate usage: Bariatric Advantage Multivitamin Lot # [id number] Exp: 6/19  Celebrate Vitamins Calcium Citrate Lot # [id number] Exp:10/19  Unjury Protein  Lot # [id number] Exp: sep-16-18 Exp:   The following the learning objectives were met by the patient during this course:  Identify Pre-Op Dietary Goals and will begin 2 weeks pre-operatively  Identify appropriate sources of fluids and proteins   State protein recommendations and appropriate sources pre and post-operatively  Identify Post-Operative Dietary Goals and will follow for 2 weeks post-operatively  Identify appropriate multivitamin and calcium sources  Describe the need for physical activity post-operatively and will follow MD recommendations  State when to call healthcare provider regarding medication questions or post-operative complications  Handouts given during class include:  Pre-Op Bariatric Surgery Diet Handout  Protein Shake Handout  Post-Op Bariatric Surgery Nutrition Handout  BELT Program Information Flyer  Support Group Information Flyer  [facility] Bariatric Supplements Price List  Follow[facility] Plan: [facility] diet advancement per MD.

## 2016-11-24 ENCOUNTER — Ambulatory Visit: Payer: Self-pay | Admitting: General Surgery

## 2016-11-24 DIAGNOSIS — G4730 Sleep apnea, unspecified: Secondary | ICD-10-CM | POA: Diagnosis not present

## 2016-11-24 NOTE — H&P (Signed)
History of Present Illness Geoffery Spruce, MD; 11/24/2016 4:58 PM) The patient is a 56 year old male who presents for a bariatric surgery evaluation. Patient has completed all necessary workup in the preoperative phase for bariatric surgery. The patient is now ready to proceed with surgery. The patient has made moderate improvements in her diet and is exercising well and has no new medical issues or new medications.    Allergies [name], [place]; 11/24/2016 4:25 PM) No Known Allergies 03/17/2016  Medication History [name], [place]; 11/24/2016 4:26 PM) Allopurinol (300MG  Tablet, Oral) Active. HydroCHLOROthiazide (12.5MG  Capsule, Oral) Active. Tamsulosin HCl (0.4MG  Capsule, Oral) Active. Aspirin (81MG  Tablet DR, Oral) Active. Magnesium Oxide (400MG  Tablet, Oral) Active. Medications Reconciled    Review of Systems Geoffery Spruce MD; 11/24/2016 4:57 PM) General Present- Fatigue, Night Sweats and Weight Gain. Not Present- Appetite Loss, Chills, Fever and Weight Loss. Skin Not Present- Change in Wart/Mole, Dryness, Hives, Jaundice, New Lesions, Non-Healing Wounds, Rash and Ulcer. HEENT Present- Hearing Loss and Wears glasses/contact lenses. Not Present- Earache, Hoarseness, Nose Bleed, Oral Ulcers, Ringing in the Ears, Seasonal Allergies, Sinus Pain, Sore Throat, Visual Disturbances and Yellow Eyes. Respiratory Present- Difficulty Breathing, Snoring and Wheezing. Not Present- Bloody sputum and Chronic Cough. Cardiovascular Present- Difficulty Breathing Lying Down, Leg Cramps and Shortness of Breath. Not Present- Chest Pain, Palpitations, Rapid Heart Rate and Swelling of Extremities. Gastrointestinal Not Present- Abdominal Pain, Bloating, Bloody Stool, Change in Bowel Habits, Chronic diarrhea, Constipation, Difficulty Swallowing, Excessive gas, Gets full quickly at meals, Hemorrhoids, Indigestion, Nausea, Rectal Pain and Vomiting. Male Genitourinary Not Present- Blood in  Urine, Change in Urinary Stream, Frequency, Impotence, Nocturia, Painful Urination, Urgency and Urine Leakage. Musculoskeletal Present- Back Pain, Joint Pain and Muscle Weakness. Not Present- Joint Stiffness, Muscle Pain and Swelling of Extremities. Neurological Not Present- Decreased Memory, Fainting, Headaches, Numbness, Seizures, Tingling, Tremor, Trouble walking and Weakness. Psychiatric Not Present- Anxiety, Bipolar, Change in Sleep Pattern, Depression, Fearful and Frequent crying. Endocrine Present- Excessive Hunger. Not Present- Cold Intolerance, Hair Changes, Heat Intolerance and New Diabetes. Hematology Not Present- Blood Thinners, Easy Bruising, Excessive bleeding, Gland problems, HIV and Persistent Infections.  Vitals [name] RMA; 11/24/2016 4:26 PM) 11/24/2016 4:26 PM Weight: 305 lb Height: 73in Body Surface Area: 2.57 m Body Mass Index: 40.24 kg/m  Temp.: 98.42F  Pulse: 87 (Regular)  BP: 132/80 (Sitting, Left Arm, Standard)       Physical Exam Geoffery Spruce MD; 11/24/2016 4:57 PM) General Mental Status-Alert. General Appearance-Cooperative. Orientation-Oriented X4. Build & Nutrition-Obese. Posture-Normal posture.  Integumentary Global Assessment Upon inspection and palpation of skin surfaces of the - Head/Face: no rashes, ulcers, lesions or evidence of photo damage. No palpable nodules or masses and Neck: no visible lesions or palpable masses.  Head and Neck Head-normocephalic, atraumatic with no lesions or palpable masses. Face Global Assessment - atraumatic. Thyroid Gland Characteristics - normal size and consistency.  Eye Eyeball - Bilateral-Extraocular movements intact. Sclera/Conjunctiva - Bilateral-No scleral icterus, No Discharge.  ENMT Nose and Sinuses External Inspection of the Nose - no deformities observed, no swelling present.  Chest and Lung Exam Palpation Palpation of the chest reveals -  Non-tender. Auscultation Breath sounds - Normal.  Cardiovascular Auscultation Rhythm - Regular. Heart Sounds - S1 WNL and S2 WNL. Carotid arteries - No Carotid bruit.  Abdomen Inspection Inspection of the abdomen reveals - No Visible peristalsis, No Abnormal pulsations and No Paradoxical movements. Palpation/Percussion Palpation and Percussion of the abdomen reveal - Soft, Non Tender, No Rebound  tenderness, No Rigidity (guarding), No hepatosplenomegaly and No Palpable abdominal masses. Note: periumbilical scar   Peripheral Vascular Upper Extremity Palpation - Pulses bilaterally normal. Lower Extremity Palpation - Edema - Bilateral - No edema.  Neurologic Neurologic evaluation reveals -normal sensation and normal coordination.  Neuropsychiatric Mental status exam performed with findings of-able to articulate well with normal speech/language, rate, volume and coherence and thought content normal with ability to perform basic computations and apply abstract reasoning.  Musculoskeletal Normal Exam - Bilateral-Upper Extremity Strength Normal and Lower Extremity Strength Normal.    Assessment & Plan Geoffery Spruce MD; 11/24/2016 4:58 PM) MORBID OBESITY (E66.01) Story: 56 yo male with morbid obesity and sleep apnea. He has completed a workup and is ready to proceed with the sleeve gastrectomy. Impression: We again discussed the details of the operation: Will be done under general anesthesia with an endotracheal tube, that it will be a laparoscopic procedure with 6 small incisions large one being 1.5-2in, that we will remove remove the short gastrics and other vessels to the greater curve of the stomach, place a large Bougie down the stomach and then remove a percent of the stomach using a linear stapler. We discussed the procedure will take approximately 1.5-2 hours. We discussed risks of VTE, staple line leak, skin infection, sleeve stenosis, incisional hernia, need for open  procedure, and postoperative nausea and vomiting. SLEEP APNEA (G47.30) Impression: on cpap

## 2016-11-30 NOTE — Progress Notes (Signed)
ekg 03-30-16 epic cxr 03-30-16 epic

## 2016-11-30 NOTE — Patient Instructions (Addendum)
Cory Reynolds  11/30/2016   Your procedure is scheduled on: 12-06-16  Report to [facility] Main  Entrance [facility]  elevators to 3rd floor to  [place] at 530AM.   Call this number if you have problems the morning of surgery [phone]   Remember: ONLY 1 PERSON MAY GO WITH YOU TO SHORT STAY TO GET  READY MORNING OF YOUR SURGERY.  Do not eat food or drink liquids :After Midnight.     Take these medicines the morning of surgery with A SIP OF WATER: none                                 You may not have any metal on your body including hair pins and              piercings  Do not wear jewelry, make-up, lotions, powders or perfumes, deodorant                         Men may shave face and neck.   Do not bring valuables to the hospital. [name]  Contacts, dentures or bridgework may not be worn into surgery.  Leave suitcase in the car. After surgery it may be brought to your room.                  Please read over the following fact sheets you were given: _____________________________________________________________________             [facility] - Preparing for Surgery Before surgery, you can play an important role.  Because skin is not sterile, your skin needs to be as free of germs as possible.  You can reduce the number of germs on your skin by washing with CHG (chlorahexidine gluconate) soap before surgery.  CHG is an antiseptic cleaner which kills germs and bonds with the skin to continue killing germs even after washing. Please DO NOT use if you have an allergy to CHG or antibacterial soaps.  If your skin becomes reddened/irritated stop using the CHG and inform your nurse when you arrive at Short Stay. Do not shave (including legs and underarms) for at least 48 hours prior to the first CHG shower.  You may shave your face/neck. Please follow these instructions carefully:  1.  Shower with  CHG Soap the night before surgery and the  morning of Surgery.  2.  If you choose to wash your hair, wash your hair first as usual with your  normal  shampoo.  3.  After you shampoo, rinse your hair and body thoroughly to remove the  shampoo.                           4.  Use CHG as you would any other liquid soap.  You can apply chg directly  to the skin and wash                       Gently with a scrungie or clean washcloth.  5.  Apply the CHG Soap to your body ONLY FROM THE NECK DOWN.   Do not use on face/ open  Wound or open sores. Avoid contact with eyes, ears mouth and genitals (private parts).                       Wash face,  Genitals (private parts) with your normal soap.             6.  Wash thoroughly, paying special attention to the area where your surgery  will be performed.  7.  Thoroughly rinse your body with warm water from the neck down.  8.  DO NOT shower/wash with your normal soap after using and rinsing off  the CHG Soap.                9.  Pat yourself dry with a clean towel.            10.  Wear clean pajamas.            11.  Place clean sheets on your bed the night of your first shower and do not  sleep with pets. Day of Surgery : Do not apply any lotions/deodorants the morning of surgery.  Please wear clean clothes to the hospital/surgery center.  FAILURE TO FOLLOW THESE INSTRUCTIONS MAY RESULT IN THE CANCELLATION OF YOUR SURGERY PATIENT SIGNATURE_________________________________  NURSE SIGNATURE__________________________________  ________________________________________________________________________   Cory Reynolds  An incentive spirometer is a tool that can help keep your lungs clear and active. This tool measures how well you are filling your lungs with each breath. Taking long deep breaths may help reverse or decrease the chance of developing breathing (pulmonary) problems (especially infection) following:  A long period of  time when you are unable to move or be active. BEFORE THE PROCEDURE   If the spirometer includes an indicator to show your best effort, your nurse or respiratory therapist will set it to a desired goal.  If possible, sit up straight or lean slightly forward. Try not to slouch.  Hold the incentive spirometer in an upright position. INSTRUCTIONS FOR USE  1. Sit on the edge of your bed if possible, or sit up as far as you can in bed or on a chair. 2. Hold the incentive spirometer in an upright position. 3. Breathe out normally. 4. Place the mouthpiece in your mouth and seal your lips tightly around it. 5. Breathe in slowly and as deeply as possible, raising the piston or the ball toward the top of the column. 6. Hold your breath for 3-5 seconds or for as long as possible. Allow the piston or ball to fall to the bottom of the column. 7. Remove the mouthpiece from your mouth and breathe out normally. 8. Rest for a few seconds and repeat Steps 1 through 7 at least 10 times every 1-2 hours when you are awake. Take your time and take a few normal breaths between deep breaths. 9. The spirometer may include an indicator to show your best effort. Use the indicator as a goal to work toward during each repetition. 10. After each set of 10 deep breaths, practice coughing to be sure your lungs are clear. If you have an incision (the cut made at the time of surgery), support your incision when coughing by placing a pillow or rolled up towels firmly against it. Once you are able to get out of bed, walk around indoors and cough well. You may stop using the incentive spirometer when instructed by your caregiver.  RISKS AND COMPLICATIONS  Take your time so you do not get  dizzy or light-headed.  If you are in pain, you may need to take or ask for pain medication before doing incentive spirometry. It is harder to take a deep breath if you are having pain. AFTER USE  Rest and breathe slowly and easily.  It can  be helpful to keep track of a log of your progress. Your caregiver can provide you with a simple table to help with this. If you are using the spirometer at home, follow these instructions: [name] IF:   You are having difficultly using the spirometer.  You have trouble using the spirometer as often as instructed.  Your pain medication is not giving enough relief while using the spirometer.  You develop fever of 100.5 F (38.1 C) or higher. SEEK IMMEDIATE MEDICAL CARE IF:   You cough up bloody sputum that had not been present before.  You develop fever of 102 F (38.9 C) or greater.  You develop worsening pain at or near the incision site. MAKE SURE YOU:   Understand these instructions.  Will watch your condition.  Will get help right away if you are not doing well or get worse. Document Released: 12/12/2006 Document Revised: 10/24/2011 Document Reviewed: 02/12/2007 [facility] Patient Information 2014 [place], [place].   ________________________________________________________________________

## 2016-12-05 ENCOUNTER — Encounter (HOSPITAL_COMMUNITY): Payer: Self-pay

## 2016-12-05 ENCOUNTER — Encounter (HOSPITAL_COMMUNITY)
Admission: RE | Admit: 2016-12-05 | Discharge: 2016-12-05 | Disposition: A | Discharge status: Home / Self Care | Payer: BLUE CROSS/BLUE SHIELD | Source: Ambulatory Visit | Attending: General Surgery | Admitting: General Surgery

## 2016-12-05 DIAGNOSIS — E669 Obesity, unspecified: Secondary | ICD-10-CM | POA: Diagnosis not present

## 2016-12-05 DIAGNOSIS — Z6841 Body Mass Index (BMI) 40.0 and over, adult: Secondary | ICD-10-CM | POA: Diagnosis not present

## 2016-12-05 DIAGNOSIS — I10 Essential (primary) hypertension: Secondary | ICD-10-CM | POA: Diagnosis not present

## 2016-12-05 DIAGNOSIS — Z7982 Long term (current) use of aspirin: Secondary | ICD-10-CM | POA: Diagnosis not present

## 2016-12-05 DIAGNOSIS — G4733 Obstructive sleep apnea (adult) (pediatric): Secondary | ICD-10-CM | POA: Diagnosis not present

## 2016-12-05 DIAGNOSIS — Z6838 Body mass index (BMI) 38.0-38.9, adult: Secondary | ICD-10-CM | POA: Diagnosis not present

## 2016-12-05 DIAGNOSIS — J189 Pneumonia, unspecified organism: Secondary | ICD-10-CM | POA: Diagnosis not present

## 2016-12-05 DIAGNOSIS — Z87891 Personal history of nicotine dependence: Secondary | ICD-10-CM | POA: Diagnosis not present

## 2016-12-05 DIAGNOSIS — Z79899 Other long term (current) drug therapy: Secondary | ICD-10-CM | POA: Diagnosis not present

## 2016-12-05 DIAGNOSIS — G4730 Sleep apnea, unspecified: Secondary | ICD-10-CM | POA: Diagnosis not present

## 2016-12-05 DIAGNOSIS — R000 Tachycardia, unspecified: Secondary | ICD-10-CM | POA: Diagnosis not present

## 2016-12-05 LAB — CBC WITH DIFFERENTIAL/PLATELET
BASOS ABS: 0 10*3/uL (ref 0.0–0.1)
Basophils Relative: 0 %
EOS PCT: 3 %
Eosinophils Absolute: 0.2 10*3/uL (ref 0.0–0.7)
HCT: 45.9 % (ref 39.0–52.0)
Hemoglobin: 15.7 g/dL (ref 13.0–17.0)
LYMPHS PCT: 24 %
Lymphs Abs: 1.5 10*3/uL (ref 0.7–4.0)
MCH: 28.5 pg (ref 26.0–34.0)
MCHC: 34.2 g/dL (ref 30.0–36.0)
MCV: 83.3 fL (ref 78.0–100.0)
Monocytes Absolute: 0.7 10*3/uL (ref 0.1–1.0)
Monocytes Relative: 12 %
NEUTROS ABS: 3.8 10*3/uL (ref 1.7–7.7)
NEUTROS PCT: 61 %
PLATELETS: 244 10*3/uL (ref 150–400)
RBC: 5.51 MIL/uL (ref 4.22–5.81)
RDW: 13.7 % (ref 11.5–15.5)
WBC: 6.3 10*3/uL (ref 4.0–10.5)

## 2016-12-05 LAB — COMPREHENSIVE METABOLIC PANEL
ALT: 25 U/L (ref 17–63)
AST: 28 U/L (ref 15–41)
Albumin: 4.3 g/dL (ref 3.5–5.0)
Alkaline Phosphatase: 62 U/L (ref 38–126)
Anion gap: 8 (ref 5–15)
BUN: 28 mg/dL — AB (ref 6–20)
CHLORIDE: 102 mmol/L (ref 101–111)
CO2: 27 mmol/L (ref 22–32)
CREATININE: 1.03 mg/dL (ref 0.61–1.24)
Calcium: 9.4 mg/dL (ref 8.9–10.3)
GFR calc Af Amer: >60 mL/min (ref >60)
Glucose, Bld: 94 mg/dL (ref 65–99)
Potassium: 4.2 mmol/L (ref 3.5–5.1)
Sodium: 137 mmol/L (ref 135–145)
Total Bilirubin: 0.6 mg/dL (ref 0.3–1.2)
Total Protein: 7.3 g/dL (ref 6.5–8.1)

## 2016-12-05 MED FILL — oxyCODONE HCL 5 MG/5ML SOLN: 5 | 2 days supply | Qty: 100 | Fill #0

## 2016-12-05 NOTE — Anesthesia Preprocedure Evaluation (Addendum)
Anesthesia Evaluation  Patient identified by MRN, date of birth, ID band Patient awake    Reviewed: Allergy & Precautions, NPO status , Patient's Chart, lab work & pertinent test results  History of Anesthesia Complications Negative for: history of anesthetic complications  Airway Mallampati: III  TM Distance: >3 FB Neck ROM: Full    Dental no notable dental hx. (+) Dental Advisory Given   Pulmonary neg pulmonary ROS, sleep apnea , former smoker,    Pulmonary exam normal        Cardiovascular hypertension, Normal cardiovascular exam     Neuro/Psych negative neurological ROS  negative psych ROS   GI/Hepatic negative GI ROS, Neg liver ROS,   Endo/Other  negative endocrine ROS  Renal/GU negative Renal ROS     Musculoskeletal negative musculoskeletal ROS (+)   Abdominal   Peds  Hematology negative hematology ROS (+)   Anesthesia Other Findings Day of surgery medications reviewed with the patient.  Reproductive/Obstetrics                            Anesthesia Physical Anesthesia Plan  ASA: III  Anesthesia Plan: General   Post-op Pain Management:    Induction: Intravenous  Airway Management Planned: Oral ETT  Additional Equipment:   Intra-op Plan:   Post-operative Plan: Extubation in OR  Informed Consent: I have reviewed the patients History and Physical, chart, labs and discussed the procedure including the risks, benefits and alternatives for the proposed anesthesia with the patient or authorized representative who has indicated his/her understanding and acceptance.   Dental advisory given  Plan Discussed with: CRNA, Anesthesiologist and Surgeon  Anesthesia Plan Comments:        Anesthesia Quick Evaluation

## 2016-12-06 ENCOUNTER — Inpatient Hospital Stay (HOSPITAL_COMMUNITY): Payer: BLUE CROSS/BLUE SHIELD | Admitting: Anesthesiology

## 2016-12-06 ENCOUNTER — Inpatient Hospital Stay (HOSPITAL_COMMUNITY)
Admission: RE | Admit: 2016-12-06 | Discharge: 2016-12-08 | DRG: 621 | Disposition: A | Discharge status: Home / Self Care | Payer: BLUE CROSS/BLUE SHIELD | Source: Ambulatory Visit | Attending: General Surgery | Admitting: General Surgery

## 2016-12-06 ENCOUNTER — Encounter (HOSPITAL_COMMUNITY): Payer: Self-pay

## 2016-12-06 ENCOUNTER — Encounter (HOSPITAL_COMMUNITY)
Admission: RE | Disposition: A | Discharge status: Home / Self Care | Payer: Self-pay | Source: Ambulatory Visit | Attending: General Surgery

## 2016-12-06 DIAGNOSIS — Z87891 Personal history of nicotine dependence: Secondary | ICD-10-CM | POA: Diagnosis not present

## 2016-12-06 DIAGNOSIS — G4730 Sleep apnea, unspecified: Secondary | ICD-10-CM | POA: Diagnosis not present

## 2016-12-06 DIAGNOSIS — E669 Obesity, unspecified: Secondary | ICD-10-CM | POA: Diagnosis not present

## 2016-12-06 DIAGNOSIS — I10 Essential (primary) hypertension: Secondary | ICD-10-CM | POA: Diagnosis not present

## 2016-12-06 DIAGNOSIS — R000 Tachycardia, unspecified: Secondary | ICD-10-CM | POA: Diagnosis not present

## 2016-12-06 DIAGNOSIS — Z7982 Long term (current) use of aspirin: Secondary | ICD-10-CM | POA: Diagnosis not present

## 2016-12-06 DIAGNOSIS — G4733 Obstructive sleep apnea (adult) (pediatric): Secondary | ICD-10-CM | POA: Diagnosis present

## 2016-12-06 DIAGNOSIS — Z6838 Body mass index (BMI) 38.0-38.9, adult: Secondary | ICD-10-CM | POA: Diagnosis not present

## 2016-12-06 DIAGNOSIS — Z6841 Body Mass Index (BMI) 40.0 and over, adult: Secondary | ICD-10-CM

## 2016-12-06 DIAGNOSIS — Z79899 Other long term (current) drug therapy: Secondary | ICD-10-CM | POA: Diagnosis not present

## 2016-12-06 DIAGNOSIS — J189 Pneumonia, unspecified organism: Secondary | ICD-10-CM | POA: Diagnosis not present

## 2016-12-06 HISTORY — PX: LAPAROSCOPIC GASTRIC SLEEVE RESECTION: SHX5895

## 2016-12-06 LAB — CBC
HCT: 43.5 % (ref 39.0–52.0)
Hemoglobin: 14.9 g/dL (ref 13.0–17.0)
MCH: 27.9 pg (ref 26.0–34.0)
MCHC: 34.3 g/dL (ref 30.0–36.0)
MCV: 81.3 fL (ref 78.0–100.0)
Platelets: 231 10*3/uL (ref 150–400)
RBC: 5.35 MIL/uL (ref 4.22–5.81)
RDW: 13.6 % (ref 11.5–15.5)
WBC: 10.6 10*3/uL — ABNORMAL HIGH (ref 4.0–10.5)

## 2016-12-06 LAB — CREATININE, SERUM
Creatinine, Ser: 1.1 mg/dL (ref 0.61–1.24)
GFR calc Af Amer: >60 mL/min
GFR calc non Af Amer: >60 mL/min

## 2016-12-06 SURGERY — GASTRECTOMY, SLEEVE, LAPAROSCOPIC
Anesthesia: General | Site: Abdomen

## 2016-12-06 MED ORDER — PROPOFOL 10 MG/ML IV BOLUS
INTRAVENOUS | Status: AC
  Filled 2016-12-06: qty 40

## 2016-12-06 MED ORDER — MORPHINE SULFATE (PF) 10 MG/ML IV SOLN
1.0000 mg | INTRAVENOUS | Status: DC | PRN
  Administered 2016-12-06: 3 mg via INTRAVENOUS
  Administered 2016-12-06: 2 mg via INTRAVENOUS
  Administered 2016-12-07 (×2): 3 mg via INTRAVENOUS
  Filled 2016-12-06 (×4): qty 1

## 2016-12-06 MED ORDER — FENTANYL CITRATE (PF) 100 MCG/2ML IJ SOLN
INTRAMUSCULAR | Status: DC | PRN
  Administered 2016-12-06 (×3): 50 ug via INTRAVENOUS

## 2016-12-06 MED ORDER — EPHEDRINE 5 MG/ML INJ
INTRAVENOUS | Status: AC
  Filled 2016-12-06: qty 10

## 2016-12-06 MED ORDER — KETAMINE HCL 10 MG/ML IJ SOLN
INTRAMUSCULAR | Status: AC
  Filled 2016-12-06: qty 1

## 2016-12-06 MED ORDER — LACTATED RINGERS IV SOLN: INTRAVENOUS | Status: DC

## 2016-12-06 MED ORDER — TAMSULOSIN HCL 0.4 MG PO CAPS
0.4000 mg | ORAL_CAPSULE | Freq: Every day | ORAL | Status: DC
  Administered 2016-12-07: 0.4 mg via ORAL
  Filled 2016-12-06: qty 1

## 2016-12-06 MED ORDER — LIDOCAINE 2% (20 MG/ML) 5 ML SYRINGE
INTRAMUSCULAR | Status: AC
  Filled 2016-12-06: qty 10

## 2016-12-06 MED ORDER — PROPOFOL 10 MG/ML IV BOLUS
INTRAVENOUS | Status: DC | PRN
  Administered 2016-12-06: 20 mg via INTRAVENOUS
  Administered 2016-12-06: 250 mg via INTRAVENOUS

## 2016-12-06 MED ORDER — CHLORHEXIDINE GLUCONATE 4 % EX LIQD: 60.0000 mL | Freq: Once | CUTANEOUS | Status: DC

## 2016-12-06 MED ORDER — LIDOCAINE 2% (20 MG/ML) 5 ML SYRINGE
INTRAMUSCULAR | Status: DC | PRN
  Administered 2016-12-06: 100 mg via INTRAVENOUS

## 2016-12-06 MED ORDER — MIDAZOLAM HCL 2 MG/2ML IJ SOLN
INTRAMUSCULAR | Status: AC
  Filled 2016-12-06: qty 2

## 2016-12-06 MED ORDER — CELECOXIB 200 MG PO CAPS
400.0000 mg | ORAL_CAPSULE | ORAL | Status: AC
  Administered 2016-12-06: 400 mg via ORAL
  Filled 2016-12-06: qty 2

## 2016-12-06 MED ORDER — DEXAMETHASONE SODIUM PHOSPHATE 10 MG/ML IJ SOLN
INTRAMUSCULAR | Status: DC | PRN
  Administered 2016-12-06: 4 mg via INTRAVENOUS

## 2016-12-06 MED ORDER — EPHEDRINE SULFATE 50 MG/ML IJ SOLN
INTRAMUSCULAR | Status: DC | PRN
  Administered 2016-12-06 (×2): 10 mg via INTRAVENOUS

## 2016-12-06 MED ORDER — ACETAMINOPHEN 325 MG PO TABS: 650.0000 mg | ORAL_TABLET | ORAL | Status: DC | PRN

## 2016-12-06 MED ORDER — DEXAMETHASONE SODIUM PHOSPHATE 10 MG/ML IJ SOLN: 4.0000 mg | INTRAMUSCULAR | Status: DC

## 2016-12-06 MED ORDER — LACTATED RINGERS IV SOLN
INTRAVENOUS | Status: DC | PRN
  Administered 2016-12-06 (×2): via INTRAVENOUS

## 2016-12-06 MED ORDER — METOPROLOL TARTRATE 5 MG/5ML IV SOLN
5.0000 mg | Freq: Once | INTRAVENOUS | Status: AC
  Administered 2016-12-06: 5 mg via INTRAVENOUS
  Filled 2016-12-06: qty 5

## 2016-12-06 MED ORDER — PANTOPRAZOLE SODIUM 40 MG IV SOLR
40.0000 mg | Freq: Every day | INTRAVENOUS | Status: DC
  Administered 2016-12-06 – 2016-12-07 (×2): 40 mg via INTRAVENOUS
  Filled 2016-12-06 (×2): qty 40

## 2016-12-06 MED ORDER — GLYCOPYRROLATE 0.2 MG/ML IJ SOLN
INTRAMUSCULAR | Status: DC | PRN
  Administered 2016-12-06: 0.2 mg via INTRAVENOUS

## 2016-12-06 MED ORDER — MIDAZOLAM HCL 5 MG/5ML IJ SOLN
INTRAMUSCULAR | Status: DC | PRN
  Administered 2016-12-06: 2 mg via INTRAVENOUS

## 2016-12-06 MED ORDER — GABAPENTIN 300 MG PO CAPS
300.0000 mg | ORAL_CAPSULE | ORAL | Status: AC
  Administered 2016-12-06: 300 mg via ORAL
  Filled 2016-12-06: qty 1

## 2016-12-06 MED ORDER — OXYCODONE HCL 5 MG/5ML PO SOLN
5.0000 mg | ORAL | Status: DC | PRN
Start: 2016-12-06 — End: 2016-12-08
  Administered 2016-12-07: 10 mg via ORAL
  Administered 2016-12-07: 5 mg via ORAL
  Administered 2016-12-08: 10 mg via ORAL
  Filled 2016-12-06 (×2): qty 10
  Filled 2016-12-06: qty 5

## 2016-12-06 MED ORDER — SUGAMMADEX SODIUM 200 MG/2ML IV SOLN
INTRAVENOUS | Status: DC | PRN
  Administered 2016-12-06: 300 mg via INTRAVENOUS

## 2016-12-06 MED ORDER — LIDOCAINE 2% (20 MG/ML) 5 ML SYRINGE
INTRAMUSCULAR | Status: AC
  Filled 2016-12-06: qty 5

## 2016-12-06 MED ORDER — PREMIER PROTEIN SHAKE
2.0000 [oz_av] | ORAL | Status: DC
  Administered 2016-12-08 (×3): 2 [oz_av] via ORAL

## 2016-12-06 MED ORDER — ENOXAPARIN SODIUM 30 MG/0.3ML ~~LOC~~ SOLN
30.0000 mg | Freq: Two times a day (BID) | SUBCUTANEOUS | Status: DC
  Administered 2016-12-06 – 2016-12-08 (×4): 30 mg via SUBCUTANEOUS
  Filled 2016-12-06 (×4): qty 0.3

## 2016-12-06 MED ORDER — SUCCINYLCHOLINE CHLORIDE 200 MG/10ML IV SOSY
PREFILLED_SYRINGE | INTRAVENOUS | Status: DC | PRN
Start: 2016-12-06 — End: 2016-12-06
  Administered 2016-12-06: 120 mg via INTRAVENOUS

## 2016-12-06 MED ORDER — 0.9 % SODIUM CHLORIDE (POUR BTL) OPTIME
TOPICAL | Status: DC | PRN
Start: 2016-12-06 — End: 2016-12-06
  Administered 2016-12-06: 1000 mL

## 2016-12-06 MED ORDER — BUPIVACAINE LIPOSOME 1.3 % IJ SUSP
20.0000 mL | Freq: Once | INTRAMUSCULAR | Status: AC
  Administered 2016-12-06: 20 mL
  Filled 2016-12-06: qty 20

## 2016-12-06 MED ORDER — LIDOCAINE 2% (20 MG/ML) 5 ML SYRINGE
INTRAMUSCULAR | Status: DC | PRN
  Administered 2016-12-06: 1.5 mg/kg/h via INTRAVENOUS

## 2016-12-06 MED ORDER — ONDANSETRON HCL 4 MG/2ML IJ SOLN
4.0000 mg | INTRAMUSCULAR | Status: DC | PRN
  Administered 2016-12-06 – 2016-12-07 (×2): 4 mg via INTRAVENOUS
  Filled 2016-12-06 (×2): qty 2

## 2016-12-06 MED ORDER — GLYCOPYRROLATE 0.2 MG/ML IV SOSY
PREFILLED_SYRINGE | INTRAVENOUS | Status: AC
  Filled 2016-12-06: qty 5

## 2016-12-06 MED ORDER — LACTATED RINGERS IR SOLN
Status: DC | PRN
  Administered 2016-12-06: 1000 mL

## 2016-12-06 MED ORDER — CEFOTETAN DISODIUM-DEXTROSE 2-2.08 GM-% IV SOLR
2.0000 g | INTRAVENOUS | Status: AC
  Administered 2016-12-06: 2 g via INTRAVENOUS

## 2016-12-06 MED ORDER — CEFOTETAN DISODIUM-DEXTROSE 2-2.08 GM-% IV SOLR
INTRAVENOUS | Status: AC
  Filled 2016-12-06: qty 50

## 2016-12-06 MED ORDER — BUPIVACAINE HCL 0.25 % IJ SOLN
INTRAMUSCULAR | Status: DC | PRN
  Administered 2016-12-06: 30 mL

## 2016-12-06 MED ORDER — BUPIVACAINE HCL (PF) 0.25 % IJ SOLN
INTRAMUSCULAR | Status: AC
  Filled 2016-12-06: qty 30

## 2016-12-06 MED ORDER — HYDROCHLOROTHIAZIDE 12.5 MG PO CAPS
12.5000 mg | ORAL_CAPSULE | Freq: Every day | ORAL | Status: DC
  Administered 2016-12-07: 12.5 mg via ORAL
  Filled 2016-12-06 (×2): qty 1

## 2016-12-06 MED ORDER — ROCURONIUM BROMIDE 50 MG/5ML IV SOSY
PREFILLED_SYRINGE | INTRAVENOUS | Status: AC
  Filled 2016-12-06: qty 5

## 2016-12-06 MED ORDER — FENTANYL CITRATE (PF) 250 MCG/5ML IJ SOLN
INTRAMUSCULAR | Status: AC
  Filled 2016-12-06: qty 5

## 2016-12-06 MED ORDER — HEPARIN SODIUM (PORCINE) 5000 UNIT/ML IJ SOLN
5000.0000 [IU] | INTRAMUSCULAR | Status: AC
  Administered 2016-12-06: 5000 [IU] via SUBCUTANEOUS
  Filled 2016-12-06: qty 1

## 2016-12-06 MED ORDER — ACETAMINOPHEN 500 MG PO TABS
1000.0000 mg | ORAL_TABLET | ORAL | Status: AC
  Administered 2016-12-06: 1000 mg via ORAL
  Filled 2016-12-06: qty 2

## 2016-12-06 MED ORDER — HYDROMORPHONE HCL 1 MG/ML IJ SOLN
INTRAMUSCULAR | Status: AC
  Filled 2016-12-06: qty 1

## 2016-12-06 MED ORDER — ACETAMINOPHEN 160 MG/5ML PO SOLN: 325.0000 mg | ORAL | Status: DC | PRN

## 2016-12-06 MED ORDER — APREPITANT 40 MG PO CAPS
40.0000 mg | ORAL_CAPSULE | ORAL | Status: AC
  Administered 2016-12-06: 40 mg via ORAL
  Filled 2016-12-06: qty 1

## 2016-12-06 MED ORDER — SUGAMMADEX SODIUM 500 MG/5ML IV SOLN
INTRAVENOUS | Status: AC
  Filled 2016-12-06: qty 5

## 2016-12-06 MED ORDER — ONDANSETRON HCL 4 MG/2ML IJ SOLN
INTRAMUSCULAR | Status: AC
  Filled 2016-12-06: qty 2

## 2016-12-06 MED ORDER — SCOPOLAMINE 1 MG/3DAYS TD PT72: 1.0000 | MEDICATED_PATCH | TRANSDERMAL | Status: DC

## 2016-12-06 MED ORDER — PROMETHAZINE HCL 25 MG/ML IJ SOLN: 6.2500 mg | INTRAMUSCULAR | Status: DC | PRN

## 2016-12-06 MED ORDER — HYDROMORPHONE HCL 1 MG/ML IJ SOLN
0.2500 mg | INTRAMUSCULAR | Status: DC | PRN
  Administered 2016-12-06: 0.5 mg via INTRAVENOUS
  Administered 2016-12-06 (×2): 0.25 mg via INTRAVENOUS

## 2016-12-06 MED ORDER — SUCCINYLCHOLINE CHLORIDE 200 MG/10ML IV SOSY
PREFILLED_SYRINGE | INTRAVENOUS | Status: AC
  Filled 2016-12-06: qty 10

## 2016-12-06 MED ORDER — DEXAMETHASONE SODIUM PHOSPHATE 10 MG/ML IJ SOLN
INTRAMUSCULAR | Status: AC
  Filled 2016-12-06: qty 1

## 2016-12-06 MED ORDER — KETAMINE HCL 10 MG/ML IJ SOLN
INTRAMUSCULAR | Status: DC | PRN
  Administered 2016-12-06: 40 mg via INTRAVENOUS

## 2016-12-06 MED ORDER — SODIUM CHLORIDE 0.9 % IV SOLN
INTRAVENOUS | Status: DC
  Administered 2016-12-06 – 2016-12-08 (×4): via INTRAVENOUS

## 2016-12-06 MED ORDER — ONDANSETRON HCL 4 MG/2ML IJ SOLN
INTRAMUSCULAR | Status: DC | PRN
  Administered 2016-12-06: 4 mg via INTRAVENOUS

## 2016-12-06 MED ORDER — ROCURONIUM BROMIDE 50 MG/5ML IV SOSY
PREFILLED_SYRINGE | INTRAVENOUS | Status: DC | PRN
  Administered 2016-12-06: 10 mg via INTRAVENOUS
  Administered 2016-12-06: 20 mg via INTRAVENOUS
  Administered 2016-12-06: 50 mg via INTRAVENOUS

## 2016-12-06 MED ORDER — SCOPOLAMINE 1 MG/3DAYS TD PT72
1.0000 | MEDICATED_PATCH | TRANSDERMAL | Status: DC
  Administered 2016-12-06: 1.5 mg via TRANSDERMAL
  Filled 2016-12-06: qty 1

## 2016-12-06 SURGICAL SUPPLY — 60 items
APPLIER CLIP 5 13 M/L LIGAMAX5 (MISCELLANEOUS)
APPLIER CLIP ROT 10 11.4 M/L (STAPLE)
APPLIER CLIP ROT 13.4 12 LRG (CLIP)
BAG LAPAROSCOPIC 12 15 PORT 16 (BASKET) ×1 IMPLANT
BAG RETRIEVAL 12/15 (BASKET) ×2
BAG RETRIEVAL 12/15MM (BASKET) ×1
BANDAGE ADH SHEER 1  50/CT (GAUZE/BANDAGES/DRESSINGS) ×18 IMPLANT
BENZOIN TINCTURE PRP APPL 2/3 (GAUZE/BANDAGES/DRESSINGS) ×3 IMPLANT
BLADE SURG SZ11 CARB STEEL (BLADE) ×3 IMPLANT
CABLE HIGH FREQUENCY MONO STRZ (ELECTRODE) ×3 IMPLANT
CHLORAPREP W/TINT 26ML (MISCELLANEOUS) ×3 IMPLANT
CLIP APPLIE 5 13 M/L LIGAMAX5 (MISCELLANEOUS) IMPLANT
CLIP APPLIE ROT 10 11.4 M/L (STAPLE) IMPLANT
CLIP APPLIE ROT 13.4 12 LRG (CLIP) IMPLANT
CLOSURE WOUND 1/2 X4 (GAUZE/BANDAGES/DRESSINGS) ×1
COVER SURGICAL LIGHT HANDLE (MISCELLANEOUS) ×3 IMPLANT
DRAIN CHANNEL 19F RND (DRAIN) IMPLANT
ELECT REM PT RETURN 15FT ADLT (MISCELLANEOUS) ×3 IMPLANT
EVACUATOR SILICONE 100CC (DRAIN) IMPLANT
GAUZE SPONGE 4X4 12PLY STRL (GAUZE/BANDAGES/DRESSINGS) IMPLANT
GLOVE BIOGEL PI IND STRL 7.0 (GLOVE) ×1 IMPLANT
GLOVE BIOGEL PI INDICATOR 7.0 (GLOVE) ×2
GLOVE SURG SS PI 7.0 STRL IVOR (GLOVE) ×3 IMPLANT
GOWN STRL REUS W/TWL LRG LVL3 (GOWN DISPOSABLE) ×3 IMPLANT
GOWN STRL REUS W/TWL XL LVL3 (GOWN DISPOSABLE) ×9 IMPLANT
GRASPER SUT TROCAR 14GX15 (MISCELLANEOUS) ×3 IMPLANT
HANDLE STAPLE EGIA 4 XL (STAPLE) ×3 IMPLANT
HOVERMATT SINGLE USE (MISCELLANEOUS) ×3 IMPLANT
KIT BASIN OR (CUSTOM PROCEDURE TRAY) ×3 IMPLANT
MARKER SKIN DUAL TIP RULER LAB (MISCELLANEOUS) ×3 IMPLANT
NEEDLE SPNL 22GX3.5 QUINCKE BK (NEEDLE) ×3 IMPLANT
PACK CARDIOVASCULAR III (CUSTOM PROCEDURE TRAY) ×3 IMPLANT
RELOAD EGIA 45 MED/THCK PURPLE (STAPLE) IMPLANT
RELOAD TRI 45 ART MED THCK BLK (STAPLE) IMPLANT
RELOAD TRI 45 ART MED THCK PUR (STAPLE) IMPLANT
RELOAD TRI 60 ART MED THCK BLK (STAPLE) ×6 IMPLANT
RELOAD TRI 60 ART MED THCK PUR (STAPLE) ×9 IMPLANT
SCISSORS LAP 5X45 EPIX DISP (ENDOMECHANICALS) ×3 IMPLANT
SET IRRIG TUBING LAPAROSCOPIC (IRRIGATION / IRRIGATOR) ×3 IMPLANT
SHEARS HARMONIC ACE PLUS 45CM (MISCELLANEOUS) ×3 IMPLANT
SLEEVE GASTRECTOMY 40FR VISIGI (MISCELLANEOUS) ×3 IMPLANT
SLEEVE XCEL OPT CAN 5 100 (ENDOMECHANICALS) ×6 IMPLANT
SOLUTION ANTI FOG 6CC (MISCELLANEOUS) ×3 IMPLANT
SPONGE LAP 18X18 X RAY DECT (DISPOSABLE) ×3 IMPLANT
STRIP CLOSURE SKIN 1/2X4 (GAUZE/BANDAGES/DRESSINGS) ×2 IMPLANT
SUT ETHIBOND 0 36 GRN (SUTURE) IMPLANT
SUT ETHILON 2 0 PS N (SUTURE) IMPLANT
SUT MNCRL AB 4-0 PS2 18 (SUTURE) ×3 IMPLANT
SUT SILK 0 SH 30 (SUTURE) IMPLANT
SUT VICRYL 0 TIES 12 18 (SUTURE) ×3 IMPLANT
SYR 20CC LL (SYRINGE) ×3 IMPLANT
SYR 50ML LL SCALE MARK (SYRINGE) ×3 IMPLANT
TOWEL OR 17X26 10 PK STRL BLUE (TOWEL DISPOSABLE) ×3 IMPLANT
TOWEL OR NON WOVEN STRL DISP B (DISPOSABLE) ×3 IMPLANT
TROCAR BLADELESS 15MM (ENDOMECHANICALS) ×3 IMPLANT
TROCAR BLADELESS OPT 5 100 (ENDOMECHANICALS) ×3 IMPLANT
TUBING CONNECTING 10 (TUBING) ×2 IMPLANT
TUBING CONNECTING 10' (TUBING) ×1
TUBING ENDO SMARTCAP (MISCELLANEOUS) ×3 IMPLANT
TUBING INSUF HEATED (TUBING) ×3 IMPLANT

## 2016-12-06 NOTE — Transfer of Care (Signed)
Immediate Anesthesia Transfer of Care Note  Patient: Cory Reynolds  Procedure(s) Performed: Procedure(s): LAPAROSCOPIC GASTRIC SLEEVE RESECTION, UPPER ENDO (N/A)  Patient Location: PACU  Anesthesia Type:General  Level of Consciousness:  sedated, patient cooperative and responds to stimulation  Airway & Oxygen Therapy:Patient Spontanous Breathing and Patient connected to face mask oxgen  Post-op Assessment:  Report given to PACU RN and Post -op Vital signs reviewed and stable  Post vital signs:  Reviewed and stable  Last Vitals:  Vitals:   12/06/16 0546  BP: 112/81  Pulse: 90  Resp: 18  Temp: 84.5 C    Complications: No apparent anesthesia complications

## 2016-12-06 NOTE — Discharge Instructions (Signed)

## 2016-12-06 NOTE — Progress Notes (Signed)
Pt has refused use of cpap at this time.  Pt says he does not wear one at home and did not want to try it here.  RT explained the device and the usage and that if he changes his mind, we can bring him the device.  RT will monitor.

## 2016-12-06 NOTE — Op Note (Signed)
Preop Diagnosis: Obesity Class III  Postop Diagnosis: same  Procedure performed: laparoscopic Sleeve Gastrectomy  Assitant: Greer Pickerel  Indications:  The patient is a 56 y.o. year-old morbidly obese male who has been followed in the Bariatric Clinic as an outpatient. This patient was diagnosed with morbid obesity with a BMI of Body mass index is 38.31 kg/m. and significant co-morbidities including hypertension.  The patient was counseled extensively in the Bariatric Outpatient Clinic and after a thorough explanation of the risks and benefits of surgery (including death from complications, bowel leak, infection such as peritonitis and/or sepsis, internal hernia, bleeding, need for blood transfusion, bowel obstruction, organ failure, pulmonary embolus, deep venous thrombosis, wound infection, incisional hernia, skin breakdown, and others entailed on the consent form) and after a compliant diet and exercise program, the patient was scheduled for an elective laparoscopic sleeve gastrectomy.  Description of Operation:  Following informed consent, the patient was taken to the operating room and placed on the operating table in the supine position.  He had previously received prophylactic antibiotics and subcutaneous heparin for DVT prophylaxis in the pre-op holding area.  After induction of general endotracheal anesthesia by the anesthesiologist, the patient underwent placement of sequential compression devices, Foley catheter and an oro-gastric tube.  A timeout was confirmed by the surgery and anesthesia teams.  The patient was adequately padded at all pressure points and placed on a footboard to prevent slippage from the OR table during extremes of position during surgery.  He underwent a routine sterile prep and drape of her entire abdomen.    Next, A transverse incision was made under the left subcostal area and a 32mm optical viewing trocar was introduced into the peritoneal cavity. Pneumoperitoneum  was applied with a high flow and low pressure. A laparoscope was inserted to confirm placement. A extraperitoneal block was then placed at the lateral abdominal wall using exparel diluted with marcaine. 5 additional trocars were placed: 1 34mm trocar to the left of the midline. 1 additional 94mm trocar in the left lateral area, 1 21mm trocar in the right mid abdomen, and 1 90mm trocar in the right subcostal area.  The fat pad at the GE junction was incised and the gastrodiaphragmatic ligament was divided using the Harmonic scalpel. Next, a hole was created through the lesser omentum along the greater curve of the stomach to enter the lesser sac. The vessels along the greater omentum were  Then ligated and divided using the Harmonic scalpel moving towards the spleen and then short gastric vessels were ligated and divided in the same fashion to fully mobilize the fundus. The left crus was identified to ensure completion of the dissection. Next the antrum was measured and dissection continued inferiorly along the greater curve towards the pylorus and stopped 6cm from the pylorus.   A 40Fr ViSiGi dilator was placed into the esophgaus and along the lesser curve of the stomach and placed on suction. A 54mm 4-51mm tristapler was used to begin the resection along the antrum being sure to stay well away from the angularis by angling the jaws of the stapler towards the greater curve. An additional 77mm 4-31mm tristapler was used to continue the dissection. Then multiple 32mm 3-54mm tristapler loads were used to complete the resection staying along the [place] and ensuring the fundus was not retained by appropriately retracting it lateral. Air was inserted through the [place] to perform a leak test showing no bubbles and a neutral lie of the stomach.  The assistant then went  and performed an upper endoscopy and leak test. No bubbles were seen and the sleeve and antrum distended appropriately. The specimen was then placed in an  endocatch bag and removed by the 55mm port. The fascia of the 110mm port was closed with a 0 vicryl by suture passer. Hemostasis was ensured. Pneumoperitoneum was evacuated, all ports were removed and all incisions closed with 4-0 monocryl suture in subcuticular fashion. Glue was put in place for dressing. The patient awoke from anesthesia and was brought to pacu in stable condition. All counts were correct.  Estimated blood loss: <64ml  Specimens:  Sleeve gastrectomy  Local Anesthesia: 50 ml Exparel:0.25% Marcaine mix  Post-Op Plan:       Pain Management: PO, prn      Antibiotics: Prophylactic      Anticoagulation: Prophylactic, Starting now      Post Op Studies/Consults: Not applicable      Intended Discharge: within 48h      Intended Outpatient Follow-Up: Two Week      Intended Outpatient Studies: Not Applicable      Other: Not Applicable   Arta Bruce Kinsinger

## 2016-12-06 NOTE — H&P (View-Only) (Signed)
History of Present Illness Cory Spruce, MD; 11/24/2016 4:58 PM) The patient is a 56 year old male who presents for a bariatric surgery evaluation. Patient has completed all necessary workup in the preoperative phase for bariatric surgery. The patient is now ready to proceed with surgery. The patient has made moderate improvements in her diet and is exercising well and has no new medical issues or new medications.    Allergies Cory Reynolds, [place]; 11/24/2016 4:25 PM) No Known Allergies 03/17/2016  Medication History Cory Reynolds, [place]; 11/24/2016 4:26 PM) Allopurinol (300MG  Tablet, Oral) Active. HydroCHLOROthiazide (12.5MG  Capsule, Oral) Active. Tamsulosin HCl (0.4MG  Capsule, Oral) Active. Aspirin (81MG  Tablet DR, Oral) Active. Magnesium Oxide (400MG  Tablet, Oral) Active. Medications Reconciled    Review of Systems Cory Spruce MD; 11/24/2016 4:57 PM) General Present- Fatigue, Night Sweats and Weight Gain. Not Present- Appetite Loss, Chills, Fever and Weight Loss. Skin Not Present- Change in Wart/Mole, Dryness, Hives, Jaundice, New Lesions, Non-Healing Wounds, Rash and Ulcer. HEENT Present- Hearing Loss and Wears glasses/contact lenses. Not Present- Earache, Hoarseness, Nose Bleed, Oral Ulcers, Ringing in the Ears, Seasonal Allergies, Sinus Pain, Sore Throat, Visual Disturbances and Yellow Eyes. Respiratory Present- Difficulty Breathing, Snoring and Wheezing. Not Present- Bloody sputum and Chronic Cough. Cardiovascular Present- Difficulty Breathing Lying Down, Leg Cramps and Shortness of Breath. Not Present- Chest Pain, Palpitations, Rapid Heart Rate and Swelling of Extremities. Gastrointestinal Not Present- Abdominal Pain, Bloating, Bloody Stool, Change in Bowel Habits, Chronic diarrhea, Constipation, Difficulty Swallowing, Excessive gas, Gets full quickly at meals, Hemorrhoids, Indigestion, Nausea, Rectal Pain and Vomiting. Male Genitourinary Not Present- Blood in  Urine, Change in Urinary Stream, Frequency, Impotence, Nocturia, Painful Urination, Urgency and Urine Leakage. Musculoskeletal Present- Back Pain, Joint Pain and Muscle Weakness. Not Present- Joint Stiffness, Muscle Pain and Swelling of Extremities. Neurological Not Present- Decreased Memory, Fainting, Headaches, Numbness, Seizures, Tingling, Tremor, Trouble walking and Weakness. Psychiatric Not Present- Anxiety, Bipolar, Change in Sleep Pattern, Depression, Fearful and Frequent crying. Endocrine Present- Excessive Hunger. Not Present- Cold Intolerance, Hair Changes, Heat Intolerance and New Diabetes. Hematology Not Present- Blood Thinners, Easy Bruising, Excessive bleeding, Gland problems, HIV and Persistent Infections.  Vitals Cory Reynolds RMA; 11/24/2016 4:26 PM) 11/24/2016 4:26 PM Weight: 305 lb Height: 73in Body Surface Area: 2.57 m Body Mass Index: 40.24 kg/m  Temp.: 98.21F  Pulse: 87 (Regular)  BP: 132/80 (Sitting, Left Arm, Standard)       Physical Exam Cory Spruce MD; 11/24/2016 4:57 PM) General Mental Status-Alert. General Appearance-Cooperative. Orientation-Oriented X4. Build & Nutrition-Obese. Posture-Normal posture.  Integumentary Global Assessment Upon inspection and palpation of skin surfaces of the - Head/Face: no rashes, ulcers, lesions or evidence of photo damage. No palpable nodules or masses and Neck: no visible lesions or palpable masses.  Head and Neck Head-normocephalic, atraumatic with no lesions or palpable masses. Face Global Assessment - atraumatic. Thyroid Gland Characteristics - normal size and consistency.  Eye Eyeball - Bilateral-Extraocular movements intact. Sclera/Conjunctiva - Bilateral-No scleral icterus, No Discharge.  ENMT Nose and Sinuses External Inspection of the Nose - no deformities observed, no swelling present.  Chest and Lung Exam Palpation Palpation of the chest reveals -  Non-tender. Auscultation Breath sounds - Normal.  Cardiovascular Auscultation Rhythm - Regular. Heart Sounds - S1 WNL and S2 WNL. Carotid arteries - No Carotid bruit.  Abdomen Inspection Inspection of the abdomen reveals - No Visible peristalsis, No Abnormal pulsations and No Paradoxical movements. Palpation/Percussion Palpation and Percussion of the abdomen reveal - Soft, Non Tender, No Rebound  tenderness, No Rigidity (guarding), No hepatosplenomegaly and No Palpable abdominal masses. Note: periumbilical scar   Peripheral Vascular Upper Extremity Palpation - Pulses bilaterally normal. Lower Extremity Palpation - Edema - Bilateral - No edema.  Neurologic Neurologic evaluation reveals -normal sensation and normal coordination.  Neuropsychiatric Mental status exam performed with findings of-able to articulate well with normal speech/language, rate, volume and coherence and thought content normal with ability to perform basic computations and apply abstract reasoning.  Musculoskeletal Normal Exam - Bilateral-Upper Extremity Strength Normal and Lower Extremity Strength Normal.    Assessment & Plan Cory Spruce MD; 11/24/2016 4:58 PM) MORBID OBESITY (E66.01) Story: 56 yo male with morbid obesity and sleep apnea. He has completed a workup and is ready to proceed with the sleeve gastrectomy. Impression: We again discussed the details of the operation: Will be done under general anesthesia with an endotracheal tube, that it will be a laparoscopic procedure with 6 small incisions large one being 1.5-2in, that we will remove remove the short gastrics and other vessels to the greater curve of the stomach, place a large Bougie down the stomach and then remove a percent of the stomach using a linear stapler. We discussed the procedure will take approximately 1.5-2 hours. We discussed risks of VTE, staple line leak, skin infection, sleeve stenosis, incisional hernia, need for open  procedure, and postoperative nausea and vomiting. SLEEP APNEA (G47.30) Impression: on cpap

## 2016-12-06 NOTE — Progress Notes (Signed)
Paged Dr Redmond Pulling about rising heart rate. Was previously 7- 75. Now pulse is fluctuating around 157. Orders received to admin lopressor 5mg  IV, and obtain a 12-lead EKG.

## 2016-12-06 NOTE — Op Note (Signed)
Cory Reynolds [id number] 1961/01/10 12/06/2016  Preoperative diagnosis: morbid obesity  Postoperative diagnosis: Same   Procedure: upper endoscopy   Surgeon: Leighton Ruff. Kerissa Coia M.D., FACS   Anesthesia: Gen.   Indications for procedure: 56 y.o. year old male undergoing Laparoscopic Gastric Sleeve Resection and an EGD was requested to evaluate the new gastric sleeve.   Description of procedure: After we have completed the sleeve resection, I scrubbed out and obtained the Olympus endoscope. I gently placed endoscope in the patient's oropharynx and gently glided it down the esophagus without any difficulty under direct visualization. Once I was in the gastric sleeve, I insufflated the stomach with air. I was able to cannulate and advanced the scope through the gastric sleeve. I was able to cannulate the duodenum with ease. Dr. Kieth Brightly had placed saline in the upper abdomen. Upon further insufflation of the gastric sleeve there was no evidence of bubbles. GE junction located at 40 cm.  Upon further inspection of the gastric sleeve, the mucosa appeared normal. There is no evidence of any mucosal abnormality. The sleeve was widely patent at the angularis. There was no evidence of bleeding. The gastric sleeve was decompressed. The scope was withdrawn. The patient tolerated this portion of the procedure well. Please see Dr Amie [place] operative note for details regarding the laparoscopic gastric sleeve resection.   Leighton Ruff. Redmond Pulling, MD, FACS  General, Bariatric, & Minimally Invasive Surgery  [facility], [place]

## 2016-12-06 NOTE — Progress Notes (Signed)
Paged MD with 12lead EKG results. MD not concerned.   Paged MD to inquire of pt being able to start ice/water tonight. HR has decreased to 99. Will continue to monitor. Inquired also about the PO medication due at 2200 pt states he must take or else he cannot urinate in the am.   MD stated he may have ice/water tonight. If tolerating he may take the Flomax.

## 2016-12-06 NOTE — Interval H&P Note (Signed)
History and Physical Interval Note:  12/06/2016 7:15 AM  Cory Reynolds  has presented today for surgery, with the diagnosis of Obesity, HTN, OSA  The various methods of treatment have been discussed with the patient and family. After consideration of risks, benefits and other options for treatment, the patient has consented to  Procedure(s): LAPAROSCOPIC GASTRIC SLEEVE RESECTION, UPPER ENDO (N/A) as a surgical intervention .  The patient's history has been reviewed, patient examined, no change in status, stable for surgery.  I have reviewed the patient's chart and labs.  Questions were answered to the patient's satisfaction.     Arta Bruce Kinsinger

## 2016-12-06 NOTE — Anesthesia Postprocedure Evaluation (Addendum)
Anesthesia Post Note  Patient: Cory Reynolds  Procedure(s) Performed: Procedure(s) (LRB): LAPAROSCOPIC GASTRIC SLEEVE RESECTION, UPPER ENDO (N/A)  Patient location during evaluation: PACU Anesthesia Type: General Level of consciousness: sedated Pain management: pain level controlled Vital Signs Assessment: post-procedure vital signs reviewed and stable Respiratory status: spontaneous breathing and respiratory function stable Cardiovascular status: stable Anesthetic complications: no       Last Vitals:  Vitals:   12/06/16 1030 12/06/16 1045  BP: 123/83 136/73  Pulse: 95 85  Resp: 15 14  Temp:      Last Pain:  Vitals:   12/06/16 1030  TempSrc:   PainSc: Asleep                 Nanetta Wiegman DANIEL

## 2016-12-06 NOTE — Anesthesia Procedure Notes (Signed)
Procedure Name: Intubation Date/Time: 12/06/2016 7:39 AM Performed by: Maxwell Caul Pre-anesthesia Checklist: Patient identified, Emergency Drugs available, Suction available and Patient being monitored Patient Re-evaluated:Patient Re-evaluated prior to inductionOxygen Delivery Method: Circle system utilized Preoxygenation: Pre-oxygenation with 100% oxygen Intubation Type: IV induction Ventilation: Mask ventilation without difficulty and Oral airway inserted - appropriate to patient size Laryngoscope Size: Mac and 4 Grade View: Grade I Tube type: Oral Tube size: 7.5 mm Number of attempts: 1 Airway Equipment and Method: Stylet and Oral airway Placement Confirmation: ETT inserted through vocal cords under direct vision,  positive ETCO2 and breath sounds checked- equal and bilateral Secured at: 21 cm Tube secured with: Tape Dental Injury: Teeth and Oropharynx as per pre-operative assessment

## 2016-12-07 LAB — CBC WITH DIFFERENTIAL/PLATELET
BASOS PCT: 0 %
Basophils Absolute: 0 10*3/uL (ref 0.0–0.1)
Eosinophils Absolute: 0 10*3/uL (ref 0.0–0.7)
Eosinophils Relative: 0 %
HEMATOCRIT: 44.4 % (ref 39.0–52.0)
HEMOGLOBIN: 14.9 g/dL (ref 13.0–17.0)
LYMPHS ABS: 1.2 10*3/uL (ref 0.7–4.0)
Lymphocytes Relative: 10 %
MCH: 27.6 pg (ref 26.0–34.0)
MCHC: 33.6 g/dL (ref 30.0–36.0)
MCV: 82.4 fL (ref 78.0–100.0)
MONO ABS: 1.2 10*3/uL — AB (ref 0.1–1.0)
Monocytes Relative: 10 %
NEUTROS ABS: 9.2 10*3/uL — AB (ref 1.7–7.7)
Neutrophils Relative %: 80 %
Platelets: 256 10*3/uL (ref 150–400)
RBC: 5.39 MIL/uL (ref 4.22–5.81)
RDW: 13.8 % (ref 11.5–15.5)
WBC: 11.6 10*3/uL — ABNORMAL HIGH (ref 4.0–10.5)

## 2016-12-07 LAB — COMPREHENSIVE METABOLIC PANEL
ALBUMIN: 3.9 g/dL (ref 3.5–5.0)
ALK PHOS: 52 U/L (ref 38–126)
ALT: 28 U/L (ref 17–63)
ANION GAP: 11 (ref 5–15)
AST: 25 U/L (ref 15–41)
BILIRUBIN TOTAL: 0.7 mg/dL (ref 0.3–1.2)
BUN: 20 mg/dL (ref 6–20)
CALCIUM: 8.5 mg/dL — AB (ref 8.9–10.3)
CO2: 25 mmol/L (ref 22–32)
Chloride: 103 mmol/L (ref 101–111)
Creatinine, Ser: 0.88 mg/dL (ref 0.61–1.24)
Glucose, Bld: 95 mg/dL (ref 65–99)
POTASSIUM: 4 mmol/L (ref 3.5–5.1)
Sodium: 139 mmol/L (ref 135–145)
TOTAL PROTEIN: 6.7 g/dL (ref 6.5–8.1)

## 2016-12-07 NOTE — Progress Notes (Signed)
Patient alert and oriented, Post op day 1.  Provided support and encouragement.  Encouraged pulmonary toilet, ambulation and small sips of liquids.  All questions answered.  Will continue to monitor. 

## 2016-12-07 NOTE — Progress Notes (Signed)
Pt has refused CPAP QHS.  RT to monitor and assess as needed.  

## 2016-12-07 NOTE — Progress Notes (Signed)
  Progress Note: Metabolic and Bariatric Surgery Service   Chief Complaint: obesity  Subjective: Tachycardia, pain controlled, not tolerating water well  Objective: Vital signs in last 24 hours: Temp:  [97.5 F (36.4 C)-99.5 F (37.5 C)] 98.9 F (37.2 C) (04/25 0446) Pulse Rate:  [85-156] 94 (04/25 0446) Resp:  [14-20] 18 (04/25 0446) BP: (118-148)/(73-96) 148/83 (04/25 0446) SpO2:  [93 %-98 %] 97 % (04/25 0446) Last BM Date: 12/05/16  Intake/Output from previous day: 04/24 0701 - 04/25 0700 In: 2408.3 [I.V.:2408.3] Out: 305 [Urine:300; Blood:5] Intake/Output this shift: No intake/output data recorded.  Lungs: CTAB  Cardiovascular: RRR  Abd: soft, ATTP, ND  Extremities: no edema  Neuro: AOx4  Lab Results: CBC   Recent Labs  12/06/16 1408 12/07/16 0501  WBC 10.6* 11.6*  HGB 14.9 14.9  HCT 43.5 44.4  PLT 231 256   BMET  Recent Labs  12/05/16 1126 12/06/16 1408 12/07/16 0501  NA 137  --  139  K 4.2  --  4.0  CL 102  --  103  CO2 27  --  25  GLUCOSE 94  --  95  BUN 28*  --  20  CREATININE 1.03 1.10 0.88  CALCIUM 9.4  --  8.5*   PT/INR No results for input(s): LABPROT, INR in the last 72 hours. ABG No results for input(s): PHART, HCO3 in the last 72 hours.  Invalid input(s): PCO2, PO2  Studies/Results:  Anti-infectives: Anti-infectives    Start     Dose/Rate Route Frequency Ordered Stop   12/06/16 0645  cefoTEtan in Dextrose 5% (CEFOTAN) 2-2.08 GM-% IVPB    Comments:  Virgia Land   : cabinet override      12/06/16 0645 12/06/16 0745   12/06/16 0553  cefoTEtan in Dextrose 5% (CEFOTAN) IVPB 2 g     2 g Intravenous On call to O.R. 12/06/16 0553 12/06/16 0745      Medications: Scheduled Meds: . enoxaparin (LOVENOX) injection  30 mg Subcutaneous Q12H  . hydrochlorothiazide  12.5 mg Oral Daily  . pantoprazole (PROTONIX) IV  40 mg Intravenous QHS  . [START ON 12/08/2016] protein supplement shake  2 oz Oral Q2H  . tamsulosin  0.4 mg Oral  QHS   Continuous Infusions: . sodium chloride 100 mL/hr at 12/07/16 0200   PRN Meds:.oxyCODONE **AND** acetaminophen, acetaminophen, morphine injection, ondansetron (ZOFRAN) IV  Assessment/Plan: Patient Active Problem List   Diagnosis Date Noted  . Morbid obesity ([place]) 12/06/2016  . Vitamin D deficiency 03/07/2016  . Prediabetes 03/07/2016  . HTN (hypertension) 02/24/2016  . Obstructive sleep apnea 02/24/2016  . Recurrent pneumonia 07/02/2013  . Morbid obesity due to excess calories ([place]) 07/02/2013  . Tachycardia 07/02/2013  . Benign fibroma of prostate 09/14/2011  . Calculus of kidney 09/14/2011   s/p Procedure(s): LAPAROSCOPIC GASTRIC SLEEVE RESECTION, UPPER ENDO 12/06/2016 -continued eras protocol  Disposition:  LOS: 1 day  The patient will be in the hospital for normal postop protocol  Mickeal Skinner, MD [phone] [facility]

## 2016-12-08 LAB — CBC WITH DIFFERENTIAL/PLATELET
Basophils Absolute: 0 10*3/uL (ref 0.0–0.1)
Basophils Relative: 0 %
EOS ABS: 0.2 10*3/uL (ref 0.0–0.7)
EOS PCT: 2 %
HCT: 41.5 % (ref 39.0–52.0)
HEMOGLOBIN: 13.6 g/dL (ref 13.0–17.0)
LYMPHS ABS: 1 10*3/uL (ref 0.7–4.0)
Lymphocytes Relative: 13 %
MCH: 27.2 pg (ref 26.0–34.0)
MCHC: 32.8 g/dL (ref 30.0–36.0)
MCV: 83 fL (ref 78.0–100.0)
MONOS PCT: 13 %
Monocytes Absolute: 1.1 10*3/uL — ABNORMAL HIGH (ref 0.1–1.0)
NEUTROS PCT: 72 %
Neutro Abs: 6 10*3/uL (ref 1.7–7.7)
PLATELETS: 212 10*3/uL (ref 150–400)
RBC: 5 MIL/uL (ref 4.22–5.81)
RDW: 13.9 % (ref 11.5–15.5)
WBC: 8.3 10*3/uL (ref 4.0–10.5)

## 2016-12-08 NOTE — Discharge Summary (Signed)
Physician Discharge Summary  Cory Reynolds SVX:[id number] DOB: [birth year] DOA: 12/06/2016  PCP: Lynne Leader, MD  Admit date: 12/06/2016 Discharge date: 12/08/2016  Recommendations for Outpatient Follow-up:  1.  (include homehealth, outpatient follow-up instructions, specific recommendations for PCP to follow-up on, etc.)  Follow-up Information    Mickeal Skinner, MD. Go on 12/30/2016.   Specialty:  General Surgery Why:  at 1215 pm Contact information: [place] [address] [phone]        Mickeal Skinner, MD Follow up.   Specialty:  General Surgery Contact information: [place] [address] [phone]          Discharge Diagnoses:  Active Problems:   Morbid obesity ([place])   Surgical Procedure: Laparoscopic Sleeve Gastrectomy, upper endoscopy  Discharge Condition: Good Disposition: Home  Diet recommendation: Postoperative sleeve gastrectomy diet (liquids only)  Filed Weights   12/06/16 0559  Weight: 131.7 kg (290 lb 6.4 oz)     Hospital Course:  The patient was admitted after undergoing laparoscopic sleeve gastrectomy. POD 0 he ambulated well. POD 1 he was started on the water diet protocol and tolerated 80 ml in the first shift. Once meeting the water amount he was advanced to bariatric protein shakes which they tolerated and were discharged home POD 2.  Treatments: surgery: laparoscopic sleeve gastrectomy  Discharge Instructions  Discharge Instructions    Ambulate hourly while awake    Complete by:  As directed    Call MD for:  difficulty breathing, headache or visual disturbances    Complete by:  As directed    Call MD for:  persistant dizziness or light-headedness    Complete by:  As directed    Call MD for:  persistant nausea and vomiting    Complete by:  As directed    Call MD for:  redness, tenderness, or signs of infection (pain, swelling, redness, odor or green/yellow discharge  around incision site)    Complete by:  As directed    Call MD for:  severe uncontrolled pain    Complete by:  As directed    Call MD for:  temperature >101 F    Complete by:  As directed    Diet bariatric full liquid    Complete by:  As directed    Discharge wound care:    Complete by:  As directed    Remove Bandaids tomorrow, ok to shower tomorrow. Steristrips may fall off in 1-3 weeks.   Incentive spirometry    Complete by:  As directed    Perform hourly while awake     Allergies as of 12/08/2016   No Known Allergies     Medication List    TAKE these medications   allopurinol 300 MG tablet Commonly known as:  ZYLOPRIM Take 300 mg by mouth daily.   aspirin 81 MG tablet Take 81 mg by mouth daily.   cetirizine 10 MG tablet Commonly known as:  ZYRTEC Take 10 mg by mouth daily as needed for allergies.   hydrochlorothiazide 12.5 MG capsule Commonly known as:  MICROZIDE Take 12.5 mg by mouth daily. Notes to patient:  Monitor Blood Pressure Daily and keep a log for primary care physician.  Monitor for symptoms of dehydration.  You may need to make changes to your medications with rapid weight loss.     Magnesium 500 MG Tabs Take 1 tablet by mouth at bedtime.   tamsulosin 0.4 MG Caps capsule Commonly known  as:  FLOMAX Take 0.4 mg by mouth at bedtime.      Follow-up Information    Mickeal Skinner, MD. Go on 12/30/2016.   Specialty:  General Surgery Why:  at 1215 pm Contact information: [place] [address] [phone]        Mickeal Skinner, MD Follow up.   Specialty:  General Surgery Contact information: [place] [address] [phone]            The results of significant diagnostics from this hospitalization (including imaging, microbiology, ancillary and laboratory) are listed below for reference.    Significant Diagnostic Studies: No results found.  Labs: Basic Metabolic  Panel:  Recent Labs Lab 12/05/16 1126 12/06/16 1408 12/07/16 0501  NA 137  --  139  K 4.2  --  4.0  CL 102  --  103  CO2 27  --  25  GLUCOSE 94  --  95  BUN 28*  --  20  CREATININE 1.03 1.10 0.88  CALCIUM 9.4  --  8.5*   Liver Function Tests:  Recent Labs Lab 12/05/16 1126 12/07/16 0501  AST 28 25  ALT 25 28  ALKPHOS 62 52  BILITOT 0.6 0.7  PROT 7.3 6.7  ALBUMIN 4.3 3.9    CBC:  Recent Labs Lab 12/05/16 1126 12/06/16 1408 12/07/16 0501 12/08/16 0543  WBC 6.3 10.6* 11.6* 8.3  NEUTROABS 3.8  --  9.2* 6.0  HGB 15.7 14.9 14.9 13.6  HCT 45.9 43.5 44.4 41.5  MCV 83.3 81.3 82.4 83.0  PLT 244 231 256 212    CBG: No results for input(s): GLUCAP in the last 168 hours.  Active Problems:   Morbid obesity ([place])   Time coordinating discharge: <60min

## 2016-12-08 NOTE — Progress Notes (Signed)
  Progress Note: Metabolic and Bariatric Surgery Service   Chief Complaint/Subjective: Gurgling, tolerating more water, shake having difficulty  Objective: Vital signs in last 24 hours: Temp:  [98.3 F (36.8 C)-98.6 F (37 C)] 98.6 F (37 C) (04/26 0416) Pulse Rate:  [79-92] 92 (04/26 0416) Resp:  [15-19] 19 (04/26 0416) BP: (138-155)/(85-101) 139/89 (04/26 0416) SpO2:  [92 %-99 %] 94 % (04/26 0416) Last BM Date: 12/05/16  Intake/Output from previous day: 04/25 0701 - 04/26 0700 In: 3018.3 [P.O.:220; I.V.:2798.3] Out: 1800 [Urine:1800] Intake/Output this shift: No intake/output data recorded.  Lungs: CTAB  Cardiovascular: RRR  Abd: soft, NT, ND  Extremities: no edema  Neuro: AOx4  Lab Results: CBC   Recent Labs  12/07/16 0501 12/08/16 0543  WBC 11.6* 8.3  HGB 14.9 13.6  HCT 44.4 41.5  PLT 256 212   BMET  Recent Labs  12/05/16 1126 12/06/16 1408 12/07/16 0501  NA 137  --  139  K 4.2  --  4.0  CL 102  --  103  CO2 27  --  25  GLUCOSE 94  --  95  BUN 28*  --  20  CREATININE 1.03 1.10 0.88  CALCIUM 9.4  --  8.5*   PT/INR No results for input(s): LABPROT, INR in the last 72 hours. ABG No results for input(s): PHART, HCO3 in the last 72 hours.  Invalid input(s): PCO2, PO2  Studies/Results:  Anti-infectives: Anti-infectives    Start     Dose/Rate Route Frequency Ordered Stop   12/06/16 0645  cefoTEtan in Dextrose 5% (CEFOTAN) 2-2.08 GM-% IVPB    Comments:  Virgia Land   : cabinet override      12/06/16 0645 12/06/16 0745   12/06/16 0553  cefoTEtan in Dextrose 5% (CEFOTAN) IVPB 2 g     2 g Intravenous On call to O.R. 12/06/16 0553 12/06/16 0745      Medications: Scheduled Meds: . enoxaparin (LOVENOX) injection  30 mg Subcutaneous Q12H  . hydrochlorothiazide  12.5 mg Oral Daily  . pantoprazole (PROTONIX) IV  40 mg Intravenous QHS  . protein supplement shake  2 oz Oral Q2H  . tamsulosin  0.4 mg Oral QHS   Continuous Infusions: .  sodium chloride 100 mL/hr at 12/08/16 0559   PRN Meds:.oxyCODONE **AND** acetaminophen, acetaminophen, morphine injection, ondansetron (ZOFRAN) IV  Assessment/Plan: Patient Active Problem List   Diagnosis Date Noted  . Morbid obesity ([place]) 12/06/2016  . Vitamin D deficiency 03/07/2016  . Prediabetes 03/07/2016  . HTN (hypertension) 02/24/2016  . Obstructive sleep apnea 02/24/2016  . Recurrent pneumonia 07/02/2013  . Morbid obesity due to excess calories ([place]) 07/02/2013  . Tachycardia 07/02/2013  . Benign fibroma of prostate 09/14/2011  . Calculus of kidney 09/14/2011   s/p Procedure(s): LAPAROSCOPIC GASTRIC SLEEVE RESECTION, UPPER ENDO 12/06/2016 -continue ERAS, near goal on intake -tachycardia - currently HR < 100, will continue to monitor  Disposition:  LOS: 2 days  The patient should be discharged from the hospital today  Mickeal Skinner, MD [phone] [facility]

## 2016-12-08 NOTE — Progress Notes (Signed)
Patient alert and oriented, pain is controlled. Patient is tolerating fluids, advanced to protein shake today, patient is tolerating well.  Reviewed Gastric sleeve discharge instructions with patient and patient is able to articulate understanding.  Provided information on BELT program, Support Group and [facility] outpatient pharmacy. All questions answered, will continue to monitor.  

## 2016-12-14 ENCOUNTER — Telehealth: Payer: Self-pay

## 2016-12-14 NOTE — Telephone Encounter (Signed)
FYI, no response needed unless you have questions.Marland KitchenMarland KitchenKennieth Francois, nurse case manager with [facility], wants you to know that she has enrolled Jori Moll for nursing support over the phone.  If any questions, she can be reached at [phone].

## 2016-12-15 NOTE — Telephone Encounter (Signed)
Message left for patient on voicemail contact information provided for follow up.

## 2016-12-20 ENCOUNTER — Encounter: Payer: BLUE CROSS/BLUE SHIELD | Attending: General Surgery | Admitting: Registered"

## 2016-12-20 DIAGNOSIS — Z6841 Body Mass Index (BMI) 40.0 and over, adult: Secondary | ICD-10-CM | POA: Insufficient documentation

## 2016-12-20 DIAGNOSIS — Z713 Dietary counseling and surveillance: Secondary | ICD-10-CM | POA: Insufficient documentation

## 2016-12-20 DIAGNOSIS — E669 Obesity, unspecified: Secondary | ICD-10-CM

## 2016-12-21 NOTE — Progress Notes (Signed)
Bariatric Class:  Appt start time: 1530 end time:  1630.  2 Week Post-Operative Nutrition Class  Patient was seen on 12/20/2016 for Post-[facility]ative Nutrition [facility].   Surgery date: 12/06/2016 Surgery type: Sleeve gastrectomy Start weight at [facility]: 300.0 Weight today: 273.0 Weight change: 27.0  TANITA  BODY COMP RESULTS  12/20/2016   BMI (kg/m^2) 36.0   Fat Mass (lbs) 116.6   Fat Free Mass (lbs) 156.4   Total Body Water (lbs) 119.4   The following the learning objectives were met by the patient during this course:  Identifies Phase 3A (Soft, High Proteins) Dietary Goals and will begin from 2 weeks post-operatively to 2 months post-operatively  Identifies appropriate sources of fluids and proteins   States protein recommendations and appropriate sources post-operatively  Identifies the need for appropriate texture modifications, mastication, and bite sizes when consuming solids  Identifies appropriate multivitamin and calcium sources post-operatively  Describes the need for physical activity post-operatively and will follow MD recommendations  States when to call healthcare provider regarding medication questions or post-operative complications  Handouts given during class include:  Phase 3A: Soft, High Protein Diet Handout  Follow-Up Plan: Patient will follow-up at [facility] in 6 weeks for 2 month post-op nutrition visit for diet advancement per MD.

## 2017-01-13 DIAGNOSIS — K912 Postsurgical malabsorption, not elsewhere classified: Secondary | ICD-10-CM | POA: Diagnosis not present

## 2017-01-13 DIAGNOSIS — E669 Obesity, unspecified: Secondary | ICD-10-CM | POA: Diagnosis not present

## 2017-01-13 DIAGNOSIS — R69 Illness, unspecified: Secondary | ICD-10-CM | POA: Diagnosis not present

## 2017-01-13 DIAGNOSIS — Z9884 Bariatric surgery status: Secondary | ICD-10-CM | POA: Diagnosis not present

## 2017-01-14 NOTE — Addendum Note (Signed)
Addendum  created 01/14/17 0846 by Duane Boston, MD   Sign clinical note

## 2017-02-09 ENCOUNTER — Encounter: Payer: BLUE CROSS/BLUE SHIELD | Attending: General Surgery | Admitting: Skilled Nursing Facility1

## 2017-02-09 ENCOUNTER — Encounter: Payer: Self-pay | Admitting: Skilled Nursing Facility1

## 2017-02-09 DIAGNOSIS — Z6841 Body Mass Index (BMI) 40.0 and over, adult: Secondary | ICD-10-CM | POA: Insufficient documentation

## 2017-02-09 DIAGNOSIS — Z713 Dietary counseling and surveillance: Secondary | ICD-10-CM | POA: Insufficient documentation

## 2017-02-09 NOTE — Progress Notes (Signed)
  Follow-up visit:  8 Weeks Post-Operative sleeve Surgery  Medical Nutrition Therapy:  Appt start time: 5:15 end time: 5:42  Primary concerns today: Post-operative Bariatric Surgery Nutrition Management.  Pt states his energy level is low. Pt states the only thing he did not tolerate is pepper. Pt states he is very self aware.   Surgery date: 12/06/2016 Surgery type: Sleeve gastrectomy Start weight at [facility]: 300.0 Weight today: 273.0 Weight change: 36.6  TANITA  BODY COMP RESULTS  12/20/2016 02/09/2017   BMI (kg/m^2) 36.0 31.5   Fat Mass (lbs) 116.6 80.4   Fat Free Mass (lbs) 156.4 158   Total Body Water (lbs) 119.4 111   24-hr recall: 600-750 calories  B (AM): protein shake (30g) taking 4-5 hours  Snk (AM):  Low fat cheese L (PM): [place] and pinto beans with cheese  Snk (PM): shake (30g) D (PM): [place]  Snk (PM): protein shake (30g)  Fluid intake: 40 ounces water, 1.5-2 protein shakes, 16-24 ounces powerade zero, 1 sugar free popcicle  Estimated total protein intake: 80+ grams of protein   Medications: see list Supplementation: bari advantage, calcium   Using straws: no Drinking while eating: no Having you been chewing well:no Chewing/swallowing difficulties: no Changes in vision: no Changes to mood/headaches: no Hair loss/Cahnges to skin/Changes to nails: no Any difficulty focusing or concentrating: no Sweating: no Dizziness/Lightheaded:  Palpitations: no  Carbonated beverages: no N/V/D/C/GAS: slight constipated  Abdominal Pain: no Dumping syndrome: no  Recent physical activity:  Walking 2-2.5 miles and weight lifting every day and doing sit ups   Progress Towards Goal(s):  In progress.  Handouts given during visit include:  Ns veggies + protein   Nutritional Diagnosis:  [place]-3.3 Overweight/obesity related to past poor dietary habits and physical inactivity as evidenced by patient w/ recent sleeve surgery following dietary guidelines for continued weight  loss.    Intervention:  Nutrition counseling. Dietitian educated the pt on advancing his diet to include ns veggies. Goals: -Give your body a break and only do weight lifting 3-4 times a week  -eat all of your protein first then start in on your vegetables -Try to have vegetables for every lunch and dinner  Teaching Method Utilized:  Visual Auditory Hands on  Barriers to learning/adherence to lifestyle change: none identified  Demonstrated degree of understanding via:  Teach Back   Monitoring/Evaluation:  Dietary intake, exercise, lap band fills, and body weight.

## 2017-02-09 NOTE — Patient Instructions (Addendum)
-  Give your body a break and only do weight lifting 3-4 times a week   -eat all of your protein first then start in on your vegetables  -Try to have vegetables for every lunch and dinner

## 2017-03-23 DIAGNOSIS — K5900 Constipation, unspecified: Secondary | ICD-10-CM | POA: Diagnosis not present

## 2017-03-23 DIAGNOSIS — Z9884 Bariatric surgery status: Secondary | ICD-10-CM | POA: Diagnosis not present

## 2017-04-01 DIAGNOSIS — L259 Unspecified contact dermatitis, unspecified cause: Secondary | ICD-10-CM | POA: Diagnosis not present

## 2017-04-01 DIAGNOSIS — J309 Allergic rhinitis, unspecified: Secondary | ICD-10-CM | POA: Diagnosis not present

## 2017-04-01 DIAGNOSIS — H9319 Tinnitus, unspecified ear: Secondary | ICD-10-CM | POA: Diagnosis not present

## 2018-01-25 IMAGING — CR DG CHEST 2V
2 series · 2 of 2 positions shown · non-contrast
Comparison: None.

CLINICAL DATA: Preop for bariatric surgery

EXAM:
CHEST  2 VIEW

[chest pa]
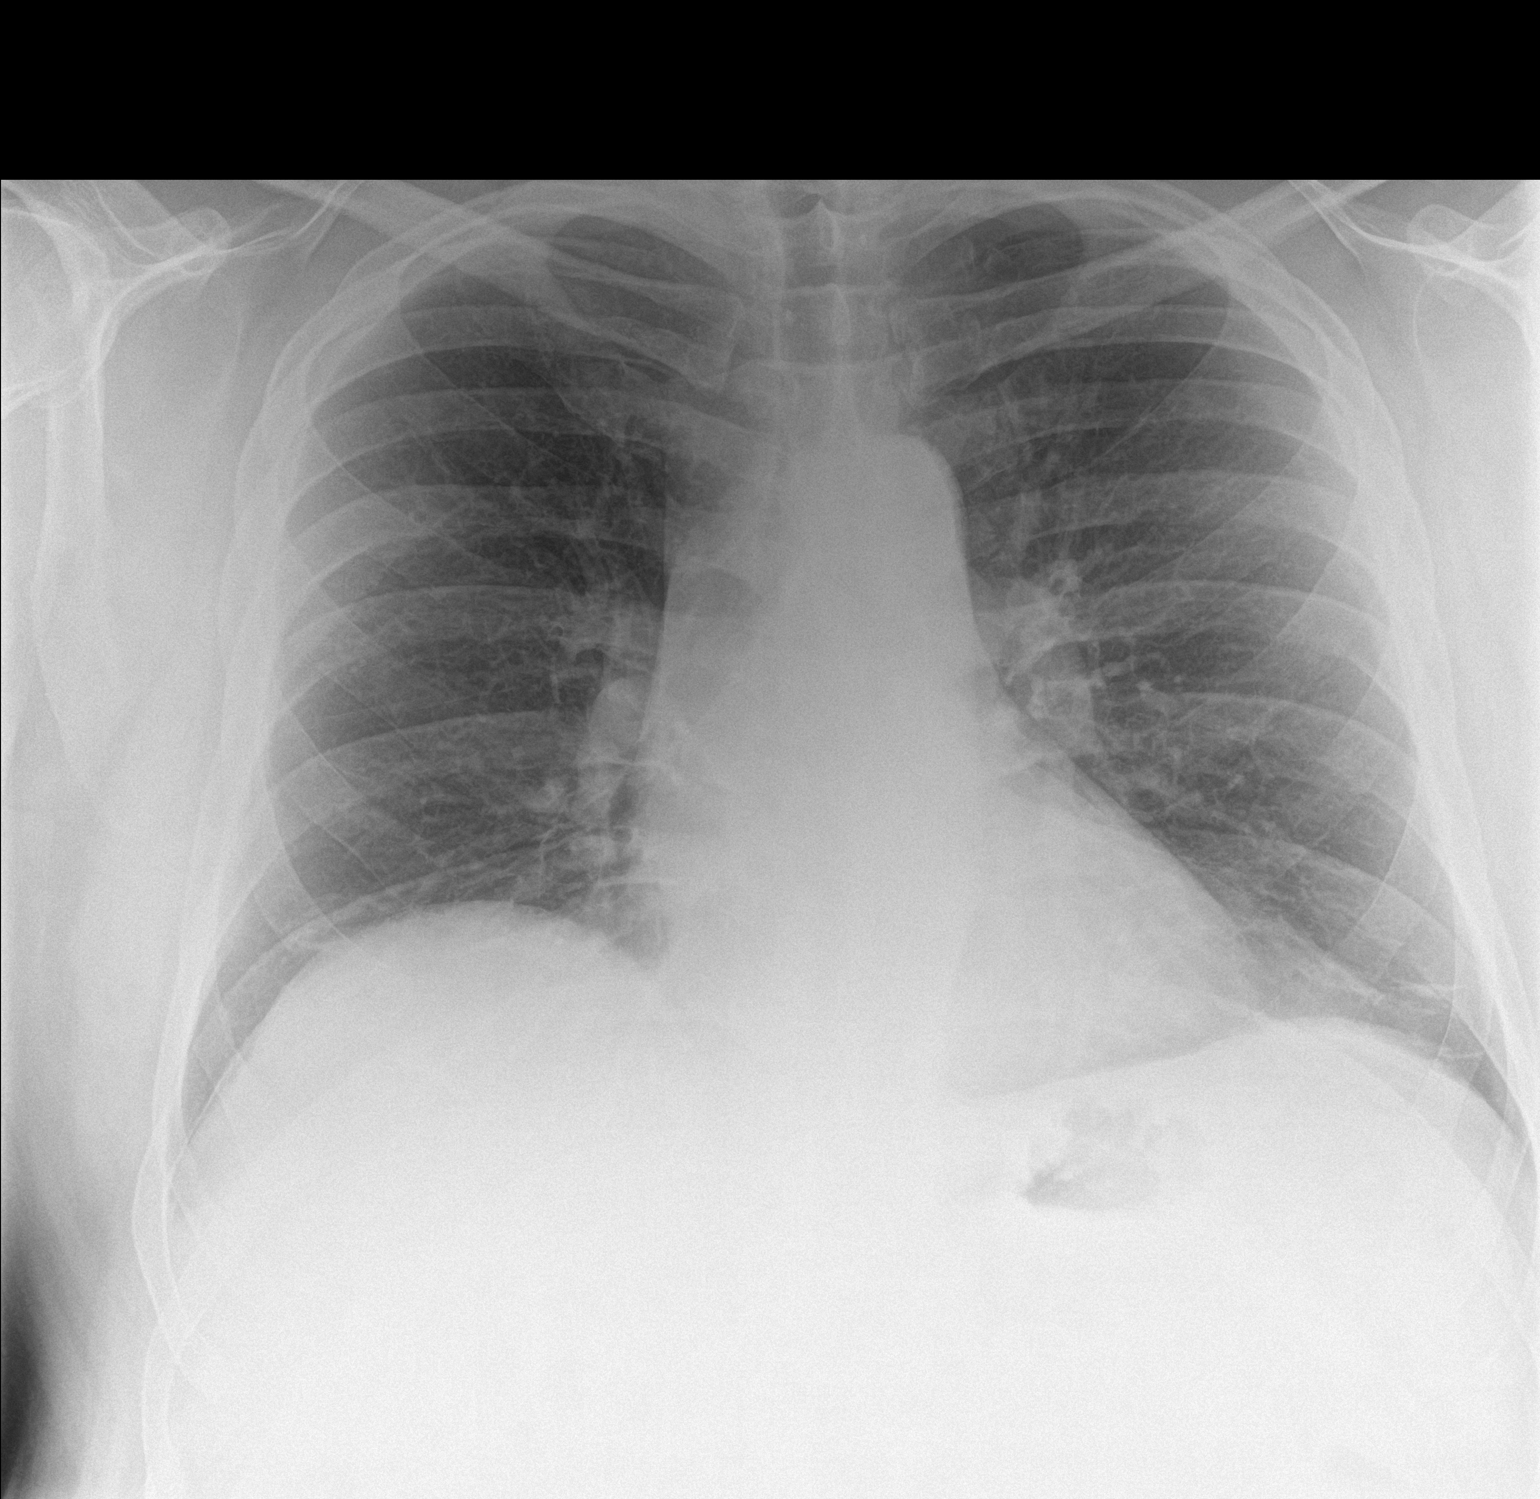

[chest lat]
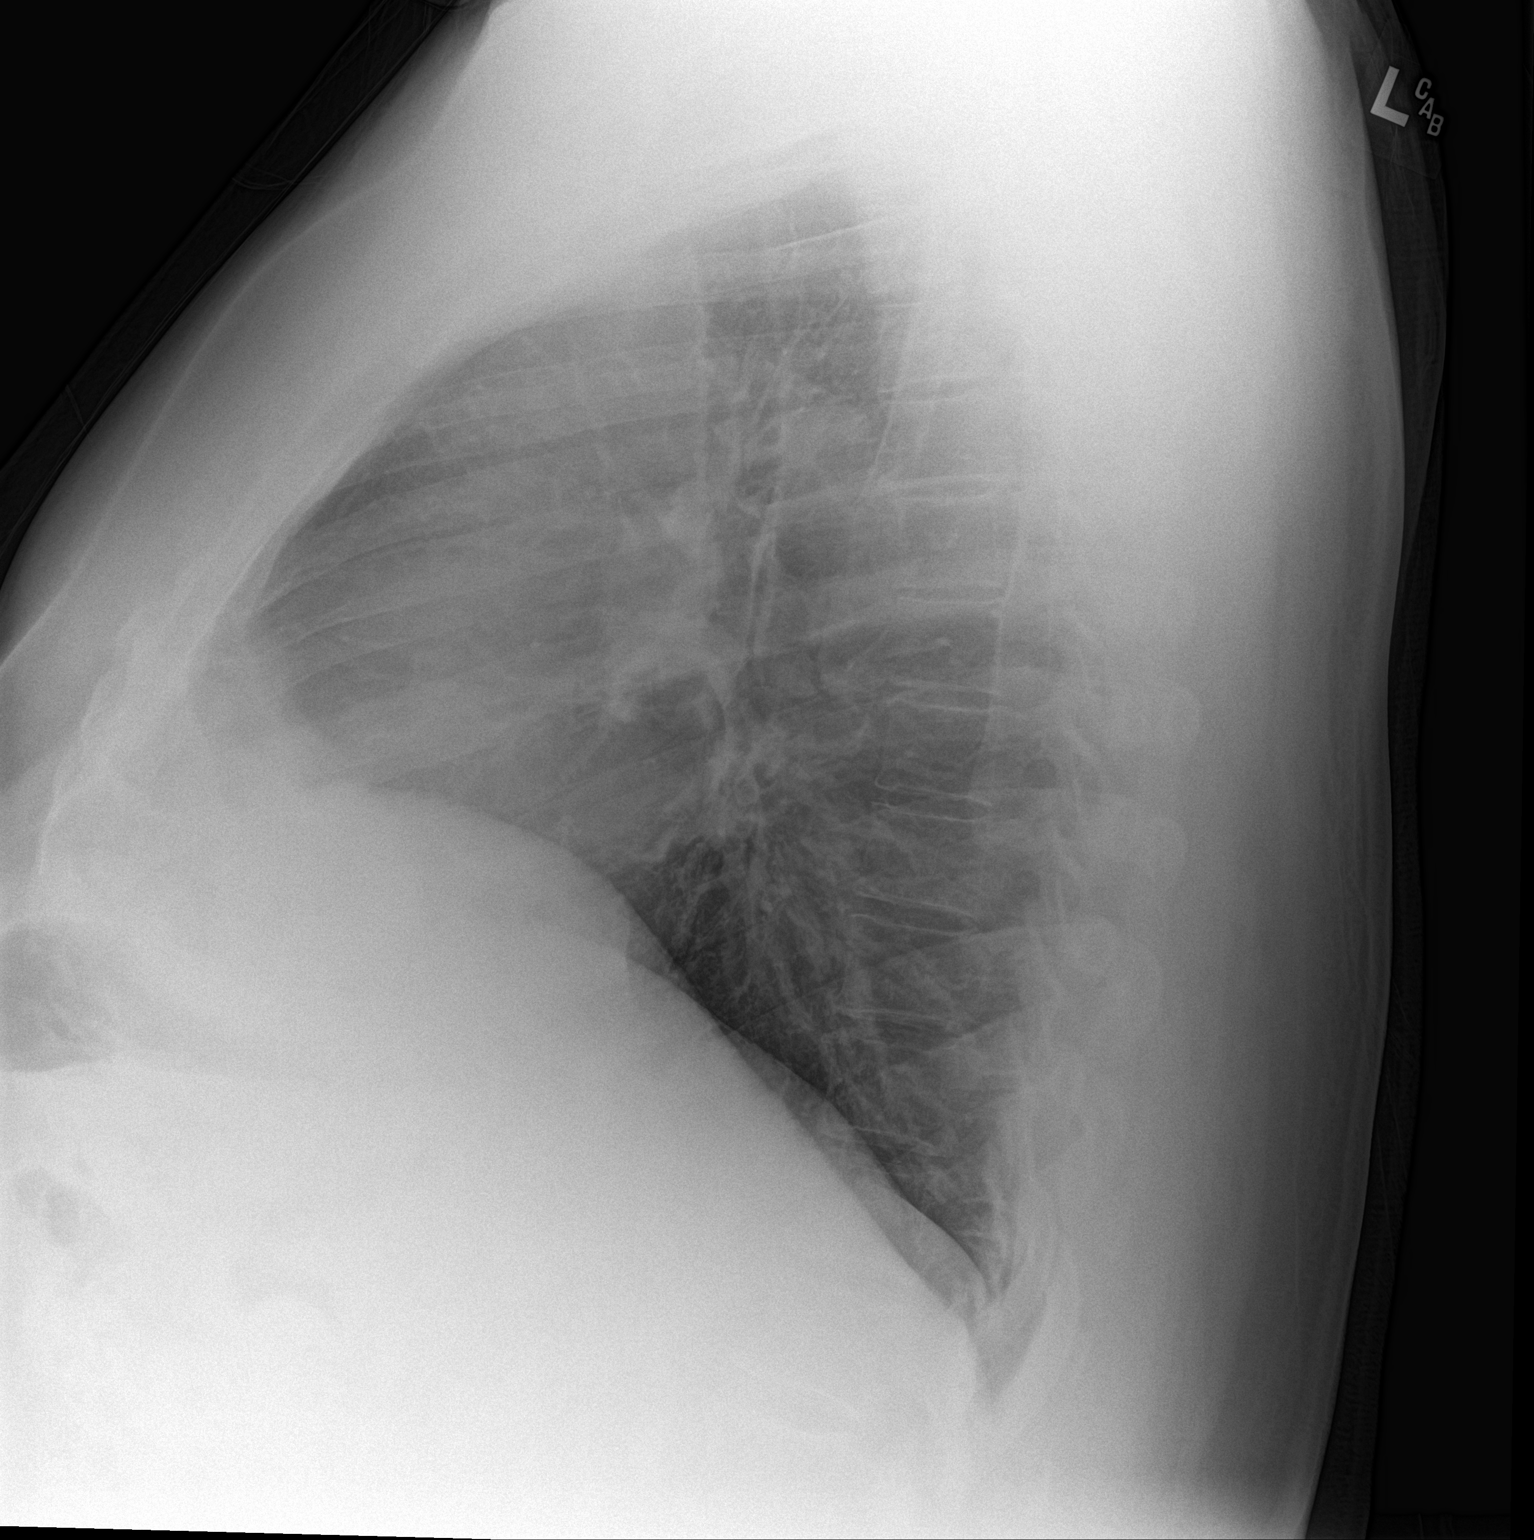

[2 of 2 positions shown; findings below may reference images not displayed]

FINDINGS: Cardiomediastinal silhouette is unremarkable. No acute infiltrate or
pleural effusion. No pulmonary edema. Bony thorax is unremarkable.
IMPRESSION: No active cardiopulmonary disease.

## 2018-05-23 ENCOUNTER — Telehealth: Payer: Self-pay | Admitting: Family Medicine

## 2018-05-23 ENCOUNTER — Encounter: Payer: Self-pay | Admitting: Family Medicine

## 2018-05-23 ENCOUNTER — Ambulatory Visit (INDEPENDENT_AMBULATORY_CARE_PROVIDER_SITE_OTHER): Payer: BLUE CROSS/BLUE SHIELD | Admitting: Family Medicine

## 2018-05-23 VITALS — BP 119/77 | HR 71 | Ht 73.0 in | Wt 228.0 lb

## 2018-05-23 DIAGNOSIS — Z9884 Bariatric surgery status: Secondary | ICD-10-CM | POA: Insufficient documentation

## 2018-05-23 DIAGNOSIS — N400 Enlarged prostate without lower urinary tract symptoms: Secondary | ICD-10-CM | POA: Insufficient documentation

## 2018-05-23 DIAGNOSIS — E790 Hyperuricemia without signs of inflammatory arthritis and tophaceous disease: Secondary | ICD-10-CM | POA: Insufficient documentation

## 2018-05-23 DIAGNOSIS — R351 Nocturia: Secondary | ICD-10-CM | POA: Diagnosis not present

## 2018-05-23 DIAGNOSIS — N200 Calculus of kidney: Secondary | ICD-10-CM

## 2018-05-23 DIAGNOSIS — Z23 Encounter for immunization: Secondary | ICD-10-CM

## 2018-05-23 DIAGNOSIS — N401 Enlarged prostate with lower urinary tract symptoms: Secondary | ICD-10-CM

## 2018-05-23 DIAGNOSIS — Z6830 Body mass index (BMI) 30.0-30.9, adult: Secondary | ICD-10-CM | POA: Insufficient documentation

## 2018-05-23 DIAGNOSIS — R7303 Prediabetes: Secondary | ICD-10-CM

## 2018-05-23 DIAGNOSIS — Z0000 Encounter for general adult medical examination without abnormal findings: Secondary | ICD-10-CM | POA: Diagnosis not present

## 2018-05-23 DIAGNOSIS — E559 Vitamin D deficiency, unspecified: Secondary | ICD-10-CM

## 2018-05-23 MED ORDER — LORCASERIN HCL ER 20 MG PO TB24
1.0000 | ORAL_TABLET | Freq: Every day | ORAL | 5 refills | Status: DC
Start: 2018-05-23 — End: 2018-11-14

## 2018-05-23 NOTE — Progress Notes (Signed)
Cory Reynolds is a 57 y.o. male who presents to [facility] today for well adult visit.  Cory Reynolds has had significant change in his health since the last visit.  He had a gastric sleeve about 18 months ago and has lost a considerable amount of weight.  He is feeling a lot better and has had resolution of his hypertension and morbid obesity.  He continues to have obesity with a BMI just above 30.  His goal weight is around 200 pounds.  He is interested in trying to reduce his weight if possible some more.  He is interested and medication if possible.  He continues to try to eat a careful diet.  Additionally he needs a referral to urology.  He was previously seen a urologist in [place] but now was living and working in the [place] area.  He has BPH and kidney stones.  He takes allopurinol for kidney stones and Flomax for BPH.  He notes continued bothersome symptoms with BPH including nocturia.  He exercises regularly and is happy with how things are going.   ROS as above:  Past Medical History:  Diagnosis Date  . BPH (benign prostatic hyperplasia)   . Kidney stones    followed by urology  . OSA (obstructive sleep apnea)    no CPAP    Past Surgical History:  Procedure Laterality Date  . LAPAROSCOPIC GASTRIC SLEEVE RESECTION N/A 12/06/2016   Procedure: LAPAROSCOPIC GASTRIC SLEEVE RESECTION, UPPER ENDO;  Surgeon: Arta Bruce Kinsinger, MD;  Location: [facility];  Service: General;  Laterality: N/A;  . LYMPH NODE BIOPSY  1978   negative  . SIGMOIDOSCOPY    . TONSILLECTOMY AND ADENOIDECTOMY    . [place]   x2   Social History   Tobacco Use  . Smoking status: Former Smoker    Types: Cigarettes    Last attempt to quit: 09/17/1983    Years since quitting: 34.7  . Smokeless tobacco: Never Used  Substance Use Topics  . Alcohol use: No   family  history includes Heart disease in his father.  Medications: Current Outpatient Medications  Medication Sig Dispense Refill  . allopurinol (ZYLOPRIM) 300 MG tablet Take 300 mg by mouth daily.     Marland Kitchen aspirin 81 MG tablet Take 81 mg by mouth daily.    . calcium carbonate (TUMS - DOSED IN MG ELEMENTAL CALCIUM) 500 MG chewable tablet Chew 1 tablet by mouth 3 (three) times daily.    . cetirizine (ZYRTEC) 10 MG tablet Take 10 mg by mouth daily as needed for allergies.    . Multiple Vitamins-Minerals (BARIATRIC MULTIVITAMINS/IRON PO) Take 1 capsule by mouth 2 (two) times daily.    . Tamsulosin HCl (FLOMAX) 0.4 MG CAPS Take 0.4 mg by mouth at bedtime.     . Lorcaserin HCl ER (BELVIQ XR) 20 MG TB24 Take 1 tablet by mouth daily. 30 tablet 5   No current facility-administered medications for this visit.    No Known Allergies  Health Maintenance Health Maintenance  Topic Date Due  . COLONOSCOPY  07/23/2020  . TETANUS/TDAP  02/23/2026  . INFLUENZA VACCINE  Completed  . Hepatitis C Screening  Completed  . HIV Screening  Completed     Exam:  BP 119/77   Pulse 71   Ht 6\' 1"  (1.854 m)   Wt 228 lb (103.4 kg)   BMI 30.08 kg/m  Wt Readings from Last 5  Encounters:  05/23/18 228 lb (103.4 kg)  02/09/17 238 lb 6.4 oz (108.1 kg)  12/21/16 273 lb (123.8 kg)  12/06/16 290 lb 6.4 oz (131.7 kg)  12/05/16 294 lb 9.6 oz (133.6 kg)      Gen: Well NAD HEENT: EOMI,  MMM Lungs: Normal work of breathing. CTABL Heart: RRR no MRG Abd: NABS, Soft. Nondistended, Nontender Exts: Brisk capillary refill, warm and well perfused.  Psych: Alert and oriented normal speech thought process and affect.  Depression screen PHQ 2/9 05/23/2018  Decreased Interest 0  Down, Depressed, Hopeless 0  PHQ - 2 Score 0  Altered sleeping 0  Tired, decreased energy 0  Change in appetite 0  Feeling bad or failure about yourself  0  Trouble concentrating 0  Moving slowly or fidgety/restless 0  Suicidal thoughts 0  PHQ-9  Score 0  Difficult doing work/chores Not difficult at all        Assessment and Plan: 57 y.o. male with well adult.  Doing very well.  We will check basic labs as part of routine health care follow-up and as well as follow-up from gastric bypass surgery including vitamin D and B12.  Additionally will check PSA given history of BPH.  Will check uric acid given history of uric acid kidney stones.  As for his continued obesity plan for trial of Belviq.  Additionally went over nutrition we will plan for 1700 -calorie/day diet with goal of 130 g of protein (30% total calories).  Additionally recommend referral or follow-up with medical bariatric clinic.   Refer to urology in [place] for continued evaluation and management of BPH  Orders Placed This Encounter  Procedures  . Flu Vaccine QUAD 36+ mos IM  . CBC  . COMPLETE METABOLIC PANEL WITH GFR  . Lipid Panel w/reflex Direct LDL  . Uric acid  . PSA  . Hemoglobin A1c  . VITAMIN D 25 Hydroxy (Vit-D Deficiency, Fractures)  . Vitamin B12  . Ambulatory referral to Urology    Referral Priority:   Routine    Referral Type:   Consultation    Referral Reason:   Specialty Services Required    Requested Specialty:   Urology    Number of Visits Requested:   1   Meds ordered this encounter  Medications  . Lorcaserin HCl ER (BELVIQ XR) 20 MG TB24    Sig: Take 1 tablet by mouth daily.    Dispense:  30 tablet    Refill:  5     Discussed warning signs or symptoms. Please see discharge instructions. Patient expresses understanding.

## 2018-05-23 NOTE — Telephone Encounter (Signed)
Received fax from [employer] that Belviq requires a PA. Information has been sent to the insurance company. Awaiting determination.   

## 2018-05-23 NOTE — Patient Instructions (Addendum)
Thank you for coming in today. Calorie goal is around 1700 calories a day.  Protein goal is 130g per day.  Try to get less than 60g of carbs per meal (less than 180 per day).   Consider making an appointment with Dr Migdalia Dk clinic for medical weight management.  Start belviq Let me know how it goes with the new meidicne.   You should hear from Urology soon. Let me know if you do not hear anything.

## 2018-05-23 NOTE — Telephone Encounter (Signed)
Received fax from [place] that [employer] has been approved from 05/23/2018 through 08/21/2018. Form sent to scan and pharmacy aware.

## 2018-05-24 ENCOUNTER — Telehealth: Payer: Self-pay | Admitting: Family Medicine

## 2018-05-24 LAB — COMPLETE METABOLIC PANEL WITH GFR
AG Ratio: 2.3 (calc) (ref 1.0–2.5)
ALBUMIN MSPROF: 4.2 g/dL (ref 3.6–5.1)
ALT: 19 U/L (ref 9–46)
AST: 15 U/L (ref 10–35)
Alkaline phosphatase (APISO): 71 U/L (ref 40–115)
BUN / CREAT RATIO: 28 (calc) — AB (ref 6–22)
BUN: 26 mg/dL — AB (ref 7–25)
CO2: 32 mmol/L (ref 20–32)
CREATININE: 0.94 mg/dL (ref 0.70–1.33)
Calcium: 9.4 mg/dL (ref 8.6–10.3)
Chloride: 104 mmol/L (ref 98–110)
GFR, Est African American: 103 mL/min/{1.73_m2} (ref >=60)
GFR, Est Non African American: 89 mL/min/{1.73_m2} (ref >=60)
GLUCOSE: 93 mg/dL (ref 65–99)
Globulin: 1.8 g/dL (calc) — ABNORMAL LOW (ref 1.9–3.7)
Potassium: 4.2 mmol/L (ref 3.5–5.3)
Sodium: 141 mmol/L (ref 135–146)
Total Bilirubin: 0.5 mg/dL (ref 0.2–1.2)
Total Protein: 6 g/dL — ABNORMAL LOW (ref 6.1–8.1)

## 2018-05-24 LAB — URIC ACID: Uric Acid, Serum: 3.8 mg/dL — ABNORMAL LOW (ref 4.0–8.0)

## 2018-05-24 LAB — CBC
HCT: 44.2 % (ref 38.5–50.0)
Hemoglobin: 14.5 g/dL (ref 13.2–17.1)
MCH: 28.4 pg (ref 27.0–33.0)
MCHC: 32.8 g/dL (ref 32.0–36.0)
MCV: 86.7 fL (ref 80.0–100.0)
MPV: 10.7 fL (ref 7.5–12.5)
PLATELETS: 237 10*3/uL (ref 140–400)
RBC: 5.1 10*6/uL (ref 4.20–5.80)
RDW: 13 % (ref 11.0–15.0)
WBC: 4.5 10*3/uL (ref 3.8–10.8)

## 2018-05-24 LAB — VITAMIN B12

## 2018-05-24 LAB — LIPID PANEL W/REFLEX DIRECT LDL
Cholesterol: 194 mg/dL
HDL: 50 mg/dL
LDL Cholesterol (Calc): 126 mg/dL — ABNORMAL HIGH
Non-HDL Cholesterol (Calc): 144 mg/dL — ABNORMAL HIGH
Total CHOL/HDL Ratio: 3.9 (calc)
Triglycerides: 84 mg/dL

## 2018-05-24 LAB — HEMOGLOBIN A1C
Hgb A1c MFr Bld: 5.4 %{Hb}
Mean Plasma Glucose: 108 (calc)
eAG (mmol/L): 6 (calc)

## 2018-05-24 LAB — VITAMIN D 25 HYDROXY (VIT D DEFICIENCY, FRACTURES): Vit D, 25-Hydroxy: 75 ng/mL (ref 30–100)

## 2018-05-24 LAB — PSA: PSA: 3.3 ng/mL

## 2018-05-24 NOTE — Telephone Encounter (Signed)
Biometric screening form completed.  On close inspection you need to sign the form in order for this to count.  The formal be ready for pickup today for you to sign.  What signed we can fax it.

## 2018-05-24 NOTE — Telephone Encounter (Signed)
Pt advised, will try to come by today to sign form so that we can fax for him

## 2018-06-15 DIAGNOSIS — D225 Melanocytic nevi of trunk: Secondary | ICD-10-CM | POA: Diagnosis not present

## 2018-06-15 DIAGNOSIS — L853 Xerosis cutis: Secondary | ICD-10-CM | POA: Diagnosis not present

## 2018-06-15 DIAGNOSIS — L290 Pruritus ani: Secondary | ICD-10-CM | POA: Diagnosis not present

## 2018-06-15 DIAGNOSIS — L84 Corns and callosities: Secondary | ICD-10-CM | POA: Diagnosis not present

## 2018-07-27 DIAGNOSIS — L290 Pruritus ani: Secondary | ICD-10-CM | POA: Diagnosis not present

## 2018-07-27 DIAGNOSIS — L84 Corns and callosities: Secondary | ICD-10-CM | POA: Diagnosis not present

## 2018-08-06 DIAGNOSIS — J0100 Acute maxillary sinusitis, unspecified: Secondary | ICD-10-CM | POA: Diagnosis not present

## 2018-08-27 ENCOUNTER — Telehealth: Payer: Self-pay | Admitting: Family Medicine

## 2018-08-27 NOTE — Telephone Encounter (Signed)
Received fax from [place] that [place] was approved from 08/27/2018 through 08/27/2019. Pharmacy notified and form sent to scan. Patient aware.

## 2018-08-30 DIAGNOSIS — J019 Acute sinusitis, unspecified: Secondary | ICD-10-CM | POA: Diagnosis not present

## 2018-08-30 DIAGNOSIS — R509 Fever, unspecified: Secondary | ICD-10-CM | POA: Diagnosis not present

## 2018-08-30 DIAGNOSIS — J209 Acute bronchitis, unspecified: Secondary | ICD-10-CM | POA: Diagnosis not present

## 2018-08-30 DIAGNOSIS — J101 Influenza due to other identified influenza virus with other respiratory manifestations: Secondary | ICD-10-CM | POA: Diagnosis not present

## 2018-10-05 DIAGNOSIS — J411 Mucopurulent chronic bronchitis: Secondary | ICD-10-CM | POA: Diagnosis not present

## 2018-11-14 ENCOUNTER — Other Ambulatory Visit: Payer: Self-pay

## 2018-11-14 ENCOUNTER — Encounter: Payer: Self-pay | Admitting: Family Medicine

## 2018-11-14 ENCOUNTER — Ambulatory Visit (INDEPENDENT_AMBULATORY_CARE_PROVIDER_SITE_OTHER): Payer: BLUE CROSS/BLUE SHIELD | Admitting: Family Medicine

## 2018-11-14 VITALS — BP 118/72 | HR 68 | Ht 73.0 in | Wt 234.0 lb

## 2018-11-14 DIAGNOSIS — R7303 Prediabetes: Secondary | ICD-10-CM

## 2018-11-14 DIAGNOSIS — N200 Calculus of kidney: Secondary | ICD-10-CM

## 2018-11-14 DIAGNOSIS — E790 Hyperuricemia without signs of inflammatory arthritis and tophaceous disease: Secondary | ICD-10-CM

## 2018-11-14 DIAGNOSIS — Z6830 Body mass index (BMI) 30.0-30.9, adult: Secondary | ICD-10-CM

## 2018-11-14 MED ORDER — TAMSULOSIN HCL 0.4 MG PO CAPS: 0.4000 mg | ORAL_CAPSULE | Freq: Every day | ORAL | 3 refills | Status: DC

## 2018-11-14 MED ORDER — ALLOPURINOL 300 MG PO TABS: 300.0000 mg | ORAL_TABLET | Freq: Every day | ORAL | 3 refills | Status: DC

## 2018-11-14 MED ORDER — PHENTERMINE HCL 37.5 MG PO CAPS: ORAL_CAPSULE | ORAL | 0 refills | Status: DC

## 2018-11-14 MED ORDER — METFORMIN HCL ER 750 MG PO TB24: 750.0000 mg | ORAL_TABLET | Freq: Every day | ORAL | 1 refills | Status: DC

## 2018-11-14 NOTE — Patient Instructions (Addendum)
Thank you for coming in today.  Start metformin and phentermine,  Recheck in 1 month via video visit.   Continue urology medicines that I sent in.   Calorie goal of 1900 calories per day.  Do not eat extra calories for exercises.  Protein goal is 100g of protein or more per day respecting total calories.   Keep track of vital signs and continue to exercise.    Metformin extended-release tablets What is this medicine? METFORMIN (met FOR min) is used to treat type 2 diabetes. It helps to control blood sugar. Treatment is combined with diet and exercise. This medicine can be used alone or with other medicines for diabetes. This medicine may be used for other purposes; ask your health care provider or pharmacist if you have questions. COMMON BRAND NAME(S): Fortamet, Glucophage XR, Glumetza What should I tell my health care provider before I take this medicine? They need to know if you have any of these conditions: -anemia -dehydration -heart disease -frequently drink alcohol-containing beverages -kidney disease -liver disease -polycystic ovary syndrome -serious infection or injury -vomiting -an unusual or allergic reaction to metformin, other medicines, foods, dyes, or preservatives -pregnant or trying to get pregnant -breast-feeding How should I use this medicine? Take this medicine by mouth with a glass of water. Follow the directions on the prescription label. Take this medicine with food. Take your medicine at regular intervals. Do not take your medicine more often than directed. Do not stop taking except on your doctor's advice. Talk to your pediatrician regarding the use of this medicine in children. Special care may be needed. Overdosage: If you think you have taken too much of this medicine contact a poison control center or emergency room at once. NOTE: This medicine is only for you. Do not share this medicine with others. What if I miss a dose? If you miss a dose, take it  as soon as you can. If it is almost time for your next dose, take only that dose. Do not take double or extra doses. What may interact with this medicine? Do not take this medicine with any of the following medications: -certain contrast medicines given before X-rays, CT scans, MRI, or other procedures -dofetilide This medicine may also interact with the following medications: -acetazolamide -alcohol -certain antivirals for HIV or hepatitis -certain medicines for blood pressure, heart disease, irregular heart beat -cimetidine -dichlorphenamide -digoxin -diuretics -male hormones, like estrogens or progestins and birth control pills -glycopyrrolate -isoniazid -lamotrigine -memantine -methazolamide -metoclopramide -midodrine -niacin -phenothiazines like chlorpromazine, mesoridazine, prochlorperazine, thioridazine -phenytoin -ranolazine -steroid medicines like prednisone or cortisone -stimulant medicines for attention disorders, weight loss, or to stay awake -thyroid medicines -topiramate -trospium -vandetanib -zonisamide This list may not describe all possible interactions. Give your health care provider a list of all the medicines, herbs, non-prescription drugs, or dietary supplements you use. Also tell them if you smoke, drink alcohol, or use illegal drugs. Some items may interact with your medicine. What should I watch for while using this medicine? Visit your doctor or health care professional for regular checks on your progress. A test called the HbA1C (A1C) will be monitored. This is a simple blood test. It measures your blood sugar control over the last 2 to 3 months. You will receive this test every 3 to 6 months. Learn how to check your blood sugar. Learn the symptoms of low and high blood sugar and how to manage them. Always carry a quick-source of sugar with you in case you have symptoms  of low blood sugar. Examples include hard sugar candy or glucose tablets. Make  sure others know that you can choke if you eat or drink when you develop serious symptoms of low blood sugar, such as seizures or unconsciousness. They must get medical help at once. Tell your doctor or health care professional if you have high blood sugar. You might need to change the dose of your medicine. If you are sick or exercising more than usual, you might need to change the dose of your medicine. Do not skip meals. Ask your doctor or health care professional if you should avoid alcohol. Many nonprescription cough and cold products contain sugar or alcohol. These can affect blood sugar. This medicine may cause ovulation in premenopausal women who do not have regular monthly periods. This may increase your chances of becoming pregnant. You should not take this medicine if you become pregnant or think you may be pregnant. Talk with your doctor or health care professional about your birth control options while taking this medicine. Contact your doctor or health care professional right away if you think you are pregnant. The tablet shell for some brands of this medicine does not dissolve. This is normal. The tablet shell may appear whole in the stool. This is not a cause for concern. If you are going to need surgery, a MRI, CT scan, or other procedure, tell your doctor that you are taking this medicine. You may need to stop taking this medicine before the procedure. Wear a medical ID bracelet or chain, and carry a card that describes your disease and details of your medicine and dosage times. This medicine may cause a decrease in folic acid and vitamin B12. You should make sure that you get enough vitamins while you are taking this medicine. Discuss the foods you eat and the vitamins you take with your health care professional. What side effects may I notice from receiving this medicine? Side effects that you should report to your doctor or health care professional as soon as possible: -allergic  reactions like skin rash, itching or hives, swelling of the face, lips, or tongue -breathing problems -feeling faint or lightheaded, falls -muscle aches or pains -signs and symptoms of low blood sugar such as feeling anxious, confusion, dizziness, increased hunger, unusually weak or tired, sweating, shakiness, cold, irritable, headache, blurred vision, fast heartbeat, loss of consciousness -slow or irregular heartbeat -unusual stomach pain or discomfort -unusually tired or weak Side effects that usually do not require medical attention (report to your doctor or health care professional if they continue or are bothersome): -diarrhea -headache -heartburn -metallic taste in mouth -nausea -stomach gas, upset This list may not describe all possible side effects. Call your doctor for medical advice about side effects. You may report side effects to FDA at 1-800-FDA-1088. Where should I keep my medicine? Keep out of the reach of children. Store at room temperature between 15 and 30 degrees C (59 and 86 degrees F). Protect from light. Throw away any unused medicine after the expiration date. NOTE: This sheet is a summary. It may not cover all possible information. If you have questions about this medicine, talk to your doctor, pharmacist, or health care provider.  2019 Elsevier/Gold Standard (2017-09-07 18:58:32)  Phentermine sustained-release capsules What is this medicine? PHENTERMINE (FEN ter meen) decreases your appetite. It is used with a reduced calorie diet and exercise to help you lose weight. This medicine may be used for other purposes; ask your health care provider or pharmacist  if you have questions. COMMON BRAND NAME(S): Ionamin, Pro-Fast What should I tell my health care provider before I take this medicine? They need to know if you have any of these conditions: -agitation or nervousness -diabetes -glaucoma -heart disease -high blood pressure -history of drug abuse or  addiction -history of stroke -kidney disease -lung disease called Primary Pulmonary Hypertension (PPH) -taken an MAOI like Carbex, Eldepryl, Marplan, Nardil, or Parnate in last 14 days -taking stimulant medicines for attention disorders, weight loss, or to stay awake -thyroid disease -an unusual or allergic reaction to phentermine, other medicines, foods, dyes, or preservatives -pregnant or trying to get pregnant -breast-feeding How should I use this medicine? Take this medicine by mouth with a glass of water. Follow the directions on the prescription label. This medicine is usually taken before breakfast or at least 10 to 14 hours before going to bed. Avoid taking this medicine in the evening. It may interfere with sleep. Swallow whole. Do not open or chew the capsules. Take your doses at regular intervals. Do not take your medicine more often than directed. Talk to your pediatrician regarding the use of this medicine in children. Special care may be needed. Overdosage: If you think you have taken too much of this medicine contact a poison control center or emergency room at once. NOTE: This medicine is only for you. Do not share this medicine with others. What if I miss a dose? If you miss a dose, take it as soon as you can. If it is almost time for your next dose, take only that dose. Do not take double or extra doses. What may interact with this medicine? Do not take this medicine with any of the following medications: -MAOIs like Carbex, Eldepryl, Marplan, Nardil, and Parnate -medicines for colds or breathing difficulties like pseudoephedrine or phenylephrine -procarbazine -sibutramine -stimulant medicines for attention disorders, weight loss, or to stay awake This medicine may also interact with the following medications: -certain medicines for depression, anxiety, or psychotic disturbances -linezolid -medicines for diabetes -medicines for high blood pressure This list may not  describe all possible interactions. Give your health care provider a list of all the medicines, herbs, non-prescription drugs, or dietary supplements you use. Also tell them if you smoke, drink alcohol, or use illegal drugs. Some items may interact with your medicine. What should I watch for while using this medicine? Notify your physician immediately if you become short of breath while doing your normal activities. Do not take this medicine within 6 hours of bedtime. It can keep you from getting to sleep. Avoid drinks that contain caffeine and try to stick to a regular bedtime every night. This medicine was intended to be used in addition to a healthy diet and exercise. The best results are achieved this way. This medicine is only indicated for short-term use. Eventually your weight loss may level out. At that point, the drug will only help you maintain your new weight. Do not increase or in any way change your dose without consulting your doctor. You may get drowsy or dizzy. Do not drive, use machinery, or do anything that needs mental alertness until you know how this medicine affects you. Do not stand or sit up quickly, especially if you are an older patient. This reduces the risk of dizzy or fainting spells. Alcohol may increase dizziness and drowsiness. Avoid alcoholic drinks. What side effects may I notice from receiving this medicine? Side effects that you should report to your doctor or health care  professional as soon as possible: -allergic reactions like skin rash, itching or hives, swelling of the face, lips, or tongue) -anxiety -breathing problems -changes in vision -chest pain or chest tightness -depressed mood or other mood changes -hallucinations, loss of contact with reality -fast, irregular heartbeat -increased blood pressure -irritable -nervousness or restlessness -painful urination -palpitations -tremors -trouble sleeping -seizures -signs and symptoms of a stroke like  changes in vision; confusion; trouble speaking or understanding; severe headaches; sudden numbness or weakness of the face, arm or leg; trouble walking; dizziness; loss of balance or coordination -unusually weak or tired -vomiting Side effects that usually do not require medical attention (report to your doctor or health care professional if they continue or are bothersome): -constipation or diarrhea -dry mouth -headache -nausea -stomach upset -sweating This list may not describe all possible side effects. Call your doctor for medical advice about side effects. You may report side effects to FDA at 1-800-FDA-1088. Where should I keep my medicine? Keep out of the reach of children. This medicine can be abused. Keep your medicine in a safe place to protect it from theft. Do not share this medicine with anyone. Selling or giving away this medicine is dangerous and against the law. This medicine may cause accidental overdose and death if taken by other adults, children, or pets. Mix any unused medicine with a substance like cat litter or coffee grounds. Then throw the medicine away in a sealed container like a sealed bag or a coffee can with a lid. Do not use the medicine after the expiration date. Store at room temperature between 20 and 25 degrees C (68 and 77 degrees F). Keep container tightly closed. NOTE: This sheet is a summary. It may not cover all possible information. If you have questions about this medicine, talk to your doctor, pharmacist, or health care provider.  2019 Elsevier/Gold Standard (2017-01-13 08:21:52)

## 2018-11-14 NOTE — Progress Notes (Addendum)
Virtual Visit  via Video Note  I connected with      Cory Reynolds  by a video enabled telemedicine application and verified that I am speaking with the correct person using two identifiers.   I discussed the limitations of evaluation and management by telemedicine and the availability of in person appointments. The patient expressed understanding and agreed to proceed.  History of Present Illness: Cory Reynolds is a 58 y.o. male who would like to discuss weight gain.   Patient had gastric sleeve on April 2018. Pre surgery Wt was 310 Nadir was 195 about 1 year ago.  He notes that over the past several months his weight has slowly been increasing.  He was seen for a wellness visit in October and prescribed Belviq for the same issue.  He notes the Belviq has not helped.  He has been exercising at least 3-4 times a day and trying to maintain his 60 g of protein per day diet while avoiding carbohydrates.  Despite all of these interventions he is slowly regaining weight.  He no longer is taking Belviq.  He has had success with phentermine and would like to do it for at least 1 month to jumpstart his weight loss.  He notes he just feels hungry all the time.  He also notes a history of kidney stones.  Has been sometime since he seen his urologist and needs a refill of the allopurinol and Flomax.    Observations/Objective: BP 118/72   Pulse 68   Ht 6\' 1"  (1.854 m)   Wt 234 lb (106.1 kg)   BMI 30.87 kg/m  Wt Readings from Last 5 Encounters:  11/14/18 234 lb (106.1 kg)  05/23/18 228 lb (103.4 kg)  02/09/17 238 lb 6.4 oz (108.1 kg)  12/21/16 273 lb (123.8 kg)  12/06/16 290 lb 6.4 oz (131.7 kg)   Exam:  Normal Speech.  No hoarseness of voice shortness of breath wheeze or stridor.  Lab and Radiology Results Lab Results  Component Value Date   HGBA1C 5.4 05/23/2018   Lab Results  Component Value Date   LABURIC 3.8 (L) 05/23/2018     Chemistry      Component Value Date/Time   NA  141 05/23/2018 0825   K 4.2 05/23/2018 0825   CL 104 05/23/2018 0825   CO2 32 05/23/2018 0825   BUN 26 (H) 05/23/2018 0825   CREATININE 0.94 05/23/2018 0825      Component Value Date/Time   CALCIUM 9.4 05/23/2018 0825   ALKPHOS 52 12/07/2016 0501   AST 15 05/23/2018 0825   ALT 19 05/23/2018 0825   BILITOT 0.5 05/23/2018 0825       Assessment and Plan: 58 y.o. male with  Weight regain following gastric sleeve.  BMI now 30.  Discussed calorie goals.  Plan for a 1900 -calorie/day diet.  Additionally will try to get about 30% of calories in the form of protein.  This protein goal would be about 140 g of protein.  I think 100 g of protein is a better target at first.  Plan to additionally prescribe extended release metformin.  Patient has a history of prediabetes and is experiencing hyperphagia.  In cases like this metformin can sometimes be helpful.  Additional use of 1 month trial of phentermine.  Would consider Qsymia in the future as well.  Recheck via video conferencing visit in 1 month.  Additionally patient needs refill of allopurinol and Flomax for history of kidney stones.  Will refill both.  Uric acid was well controlled at previous check 6 months ago.  PDMP not reviewed this encounter. No orders of the defined types were placed in this encounter.  Meds ordered this encounter  Medications  . metFORMIN (GLUCOPHAGE-XR) 750 MG 24 hr tablet    Sig: Take 1 tablet (750 mg total) by mouth daily with breakfast.    Dispense:  90 tablet    Refill:  1  . phentermine 37.5 MG capsule    Sig: One capsule by mouth qAM    Dispense:  30 capsule    Refill:  0  . allopurinol (ZYLOPRIM) 300 MG tablet    Sig: Take 1 tablet (300 mg total) by mouth daily.    Dispense:  90 tablet    Refill:  3  . tamsulosin (FLOMAX) 0.4 MG CAPS capsule    Sig: Take 1 capsule (0.4 mg total) by mouth at bedtime.    Dispense:  90 capsule    Refill:  3    Follow Up Instructions:   I discussed the assessment  and treatment plan with the patient. The patient was provided an opportunity to ask questions and all were answered. The patient agreed with the plan and demonstrated an understanding of the instructions.   The patient was advised to call back or seek an in-person evaluation if the symptoms worsen or if the condition fails to improve as anticipated.  I provided 25 minutes of non-face-to-face time during this encounter.    Historical information moved to improve visibility of documentation.  Past Medical History:  Diagnosis Date  . BPH (benign prostatic hyperplasia)   . Kidney stones    followed by urology  . OSA (obstructive sleep apnea)    no CPAP    Past Surgical History:  Procedure Laterality Date  . LAPAROSCOPIC GASTRIC SLEEVE RESECTION N/A 12/06/2016   Procedure: LAPAROSCOPIC GASTRIC SLEEVE RESECTION, UPPER ENDO;  Surgeon: Arta Bruce Kinsinger, MD;  Location: [facility];  Service: General;  Laterality: N/A;  . LYMPH NODE BIOPSY  1978   negative  . SIGMOIDOSCOPY    . TONSILLECTOMY AND ADENOIDECTOMY    . [place]   x2   Social History   Tobacco Use  . Smoking status: Former Smoker    Types: Cigarettes    Last attempt to quit: 09/17/1983    Years since quitting: 35.1  . Smokeless tobacco: Never Used  Substance Use Topics  . Alcohol use: No   family history includes Heart disease in his father.  Medications: Current Outpatient Medications  Medication Sig Dispense Refill  . allopurinol (ZYLOPRIM) 300 MG tablet Take 1 tablet (300 mg total) by mouth daily. 90 tablet 3  . aspirin 81 MG tablet Take 81 mg by mouth daily.    . calcium carbonate (TUMS - DOSED IN MG ELEMENTAL CALCIUM) 500 MG chewable tablet Chew 1 tablet by mouth 3 (three) times daily.    . cetirizine (ZYRTEC) 10 MG tablet Take 10 mg by mouth daily as needed for allergies.    . hydrochlorothiazide (MICROZIDE) 12.5 MG capsule Take 12.5 mg by mouth daily.    . Multiple Vitamins-Minerals (BARIATRIC  MULTIVITAMINS/IRON PO) Take 1 capsule by mouth 2 (two) times daily.    . tamsulosin (FLOMAX) 0.4 MG CAPS capsule Take 1 capsule (0.4 mg total) by mouth at bedtime. 90 capsule 3  . metFORMIN (GLUCOPHAGE-XR) 750 MG 24 hr tablet Take 1 tablet (750 mg total) by mouth daily with breakfast. 90 tablet  1  . phentermine 37.5 MG capsule One capsule by mouth qAM 30 capsule 0   No current facility-administered medications for this visit.    No Known Allergies       Addendum to correct incorrect date due to templating error

## 2018-12-18 ENCOUNTER — Ambulatory Visit (INDEPENDENT_AMBULATORY_CARE_PROVIDER_SITE_OTHER): Payer: BLUE CROSS/BLUE SHIELD | Admitting: Family Medicine

## 2018-12-18 ENCOUNTER — Encounter: Payer: Self-pay | Admitting: Family Medicine

## 2018-12-18 VITALS — BP 115/68 | HR 69 | Temp 98.4°F | Wt 223.0 lb

## 2018-12-18 DIAGNOSIS — R7303 Prediabetes: Secondary | ICD-10-CM | POA: Diagnosis not present

## 2018-12-18 DIAGNOSIS — Z6829 Body mass index (BMI) 29.0-29.9, adult: Secondary | ICD-10-CM

## 2018-12-18 MED ORDER — PHENTERMINE HCL 37.5 MG PO CAPS: ORAL_CAPSULE | ORAL | 0 refills | Status: DC

## 2018-12-18 NOTE — Progress Notes (Signed)
Virtual Visit via video  I connected with      Cory Reynolds  by a telemedicine application and verified that I am speaking with the correct person using two identifiers.   I discussed the limitations of evaluation and management by telemedicine and the availability of in person appointments. The patient expressed understanding and agreed to proceed.  History of Present Illness: Cory Reynolds is a 58 y.o. male who would like to discuss obesity   Cory Reynolds was seen about a month ago.  He had regained weight following gastric sleeve.  At that time we discussed treatment plan and options.  Plan for quick trial of phentermine along with 1900 -calorie/day diet.  Additionally prescribed extended release metformin.  In the interim he notes that things have gone fairly well.  He has emphasized a regimented eating plan and exercise plan.  He is lost over 10 pounds.  He has been out of the phentermine for a few days and notes that his appetite is still suppressed.  He thinks the metformin is helping quite a bit.   Observations/Objective: BP 115/68   Pulse 69   Temp 98.4 F (36.9 C) (Oral)   Wt 223 lb (101.2 kg)   BMI 29.42 kg/m  Wt Readings from Last 5 Encounters:  12/18/18 223 lb (101.2 kg)  11/14/18 234 lb (106.1 kg)  05/23/18 228 lb (103.4 kg)  02/09/17 238 lb 6.4 oz (108.1 kg)  12/21/16 273 lb (123.8 kg)   Exam: Normal Speech.  Normal appearance no acute distress.  Lab and Radiology Results No results found for this or any previous visit (from the past 72 hour(s)). No results found.   Assessment and Plan: 58 y.o. male with obesity management: Patient has managed to lose weight again.  He is off of the phentermine now for a few days.  He only is taking metformin for weight management as well as for prediabetes.  He is continuing to do well.  We will go ahead and send in phentermine refill but he will try some time off of it.  Plan on rechecking in about 2 months.  Continue lifestyle  modification.  I will optimistic that his appetite will be suppressed with metformin and he will do well with a regimented lifestyle.    PDMP not reviewed this encounter. No orders of the defined types were placed in this encounter.  No orders of the defined types were placed in this encounter.   Follow Up Instructions:    I discussed the assessment and treatment plan with the patient. The patient was provided an opportunity to ask questions and all were answered. The patient agreed with the plan and demonstrated an understanding of the instructions.   The patient was advised to call back or seek an in-person evaluation if the symptoms worsen or if the condition fails to improve as anticipated.  Time: 15 minutes of intraservice time, with >22 minutes of total time during today's visit.      Historical information moved to improve visibility of documentation.  Past Medical History:  Diagnosis Date  . BPH (benign prostatic hyperplasia)   . Kidney stones    followed by urology  . OSA (obstructive sleep apnea)    no CPAP    Past Surgical History:  Procedure Laterality Date  . LAPAROSCOPIC GASTRIC SLEEVE RESECTION N/A 12/06/2016   Procedure: LAPAROSCOPIC GASTRIC SLEEVE RESECTION, UPPER ENDO;  Surgeon: Arta Bruce Kinsinger, MD;  Location: [facility];  Service: General;  Laterality: N/A;  .  LYMPH NODE BIOPSY  1978   negative  . SIGMOIDOSCOPY    . TONSILLECTOMY AND ADENOIDECTOMY    . [place]   x2   Social History   Tobacco Use  . Smoking status: Former Smoker    Types: Cigarettes    Last attempt to quit: 09/17/1983    Years since quitting: 35.2  . Smokeless tobacco: Never Used  Substance Use Topics  . Alcohol use: No   family history includes Heart disease in his father.  Medications: Current Outpatient Medications  Medication Sig Dispense Refill  . allopurinol (ZYLOPRIM) 300 MG tablet Take 1 tablet (300 mg total) by mouth daily. 90 tablet 3  .  aspirin 81 MG tablet Take 81 mg by mouth daily.    . calcium carbonate (TUMS - DOSED IN MG ELEMENTAL CALCIUM) 500 MG chewable tablet Chew 1 tablet by mouth 3 (three) times daily.    . cetirizine (ZYRTEC) 10 MG tablet Take 10 mg by mouth daily as needed for allergies.    . hydrochlorothiazide (MICROZIDE) 12.5 MG capsule Take 12.5 mg by mouth daily.    . metFORMIN (GLUCOPHAGE-XR) 750 MG 24 hr tablet Take 1 tablet (750 mg total) by mouth daily with breakfast. 90 tablet 1  . Multiple Vitamins-Minerals (BARIATRIC MULTIVITAMINS/IRON PO) Take 1 capsule by mouth 2 (two) times daily.    . phentermine 37.5 MG capsule One capsule by mouth qAM 30 capsule 0  . tamsulosin (FLOMAX) 0.4 MG CAPS capsule Take 1 capsule (0.4 mg total) by mouth at bedtime. 90 capsule 3   No current facility-administered medications for this visit.    No Known Allergies

## 2018-12-18 NOTE — Patient Instructions (Addendum)
Thank you for coming in today.  Hold off on phentermine for a few weeks to see how you do just on metformin and with lifestyle modification. I think you are going to continue to do well. We will plan on rechecking in about 2 months. Let me know sooner if you have any problems or issues.

## 2019-02-22 ENCOUNTER — Encounter: Payer: Self-pay | Admitting: Family Medicine

## 2019-02-22 ENCOUNTER — Ambulatory Visit (INDEPENDENT_AMBULATORY_CARE_PROVIDER_SITE_OTHER): Payer: BC Managed Care – PPO | Admitting: Family Medicine

## 2019-02-22 VITALS — Ht 73.0 in | Wt 222.0 lb

## 2019-02-22 DIAGNOSIS — E790 Hyperuricemia without signs of inflammatory arthritis and tophaceous disease: Secondary | ICD-10-CM

## 2019-02-22 DIAGNOSIS — E559 Vitamin D deficiency, unspecified: Secondary | ICD-10-CM

## 2019-02-22 DIAGNOSIS — R7303 Prediabetes: Secondary | ICD-10-CM

## 2019-02-22 DIAGNOSIS — R351 Nocturia: Secondary | ICD-10-CM

## 2019-02-22 DIAGNOSIS — K625 Hemorrhage of anus and rectum: Secondary | ICD-10-CM

## 2019-02-22 DIAGNOSIS — N400 Enlarged prostate without lower urinary tract symptoms: Secondary | ICD-10-CM | POA: Diagnosis not present

## 2019-02-22 DIAGNOSIS — Z6829 Body mass index (BMI) 29.0-29.9, adult: Secondary | ICD-10-CM | POA: Diagnosis not present

## 2019-02-22 DIAGNOSIS — Z9884 Bariatric surgery status: Secondary | ICD-10-CM

## 2019-02-22 DIAGNOSIS — N401 Enlarged prostate with lower urinary tract symptoms: Secondary | ICD-10-CM

## 2019-02-22 DIAGNOSIS — N200 Calculus of kidney: Secondary | ICD-10-CM

## 2019-02-22 NOTE — Progress Notes (Signed)
Virtual Visit  via Video Note  I connected with      Smitty Pluck by a video enabled telemedicine application and verified that I am speaking with the correct person using two identifiers.   I discussed the limitations of evaluation and management by telemedicine and the availability of in person appointments. The patient expressed understanding and agreed to proceed.  History of Present Illness: Cory Reynolds is a 58 y.o. male who would like to discuss weight management.  Patient was seen May 5 most recently.  At that point he had had some weight regain after gastric sleeve.  He planned a 1900 -calorie/day diet along with extended release metformin.  He had been on phentermine previously and did well.  In the interim he was continued on metformin but did not take the phentermine that was prescribed.  His weight is continued to be stable but he is not lost weight.  He is happy with how things are going.  He feels pretty well otherwise.  He does however note that he is developed some painless rectal bleeding.  He notes the blood is mixed in with the stool as well as on the toilet paper.  He notes it is bright red at times.  He had colonoscopy in 2016 that showed polyps and diverticular disease.  No fevers chills nausea vomiting diarrhea or abdominal pain.   Observations/Objective: Ht 6\' 1"  (1.854 m)   Wt 222 lb (100.7 kg)   BMI 29.29 kg/m  Wt Readings from Last 5 Encounters:  02/22/19 222 lb (100.7 kg)  12/18/18 223 lb (101.2 kg)  11/14/18 234 lb (106.1 kg)  05/23/18 228 lb (103.4 kg)  02/09/17 238 lb 6.4 oz (108.1 kg)   Exam: Appearance Normal Speech.    Lab and Radiology Results No results found for this or any previous visit (from the past 72 hour(s)). No results found.   Assessment and Plan: 58 y.o. male with  BMI 29.  Weight stable.  Discussed options.  Plan to continue metformin.  Patient elects to try a 1 month of phentermine.  He Artie has a prescription prescribed  last time.  Will reassess in 1 month via my chart message.  Continue healthy lifestyle diet.  Bright red blood per rectum: Obviously concerning.  My ability to evaluate is limited with remote medicine.  Given that it is painless and his history of diverticular disease on colonoscopy as well as polyps I think it is reasonable for him to reevaluate with gastroenterology sooner rather than later.  Additionally we will proceed with further lab work-up.  Will check CBC and iron stores.  Since were getting labs we will also check metabolic panel lipid panel hemoglobin A1c and uric acid and PSA to follow-up his medical problems listed below.  PDMP not reviewed this encounter. Orders Placed This Encounter  Procedures  . CBC  . COMPLETE METABOLIC PANEL WITH GFR  . Lipid Panel w/reflex Direct LDL  . Hemoglobin A1c  . Fe+TIBC+Fer  . Uric acid  . PSA   No orders of the defined types were placed in this encounter.   Follow Up Instructions:    I discussed the assessment and treatment plan with the patient. The patient was provided an opportunity to ask questions and all were answered. The patient agreed with the plan and demonstrated an understanding of the instructions.   The patient was advised to call back or seek an in-person evaluation if the symptoms worsen or if the condition fails  to improve as anticipated.  Time: 15 minutes of intraservice time, with >22 minutes of total time during today's visit.      Historical information moved to improve visibility of documentation.  Past Medical History:  Diagnosis Date  . BPH (benign prostatic hyperplasia)   . Kidney stones    followed by urology  . OSA (obstructive sleep apnea)    no CPAP    Past Surgical History:  Procedure Laterality Date  . LAPAROSCOPIC GASTRIC SLEEVE RESECTION N/A 12/06/2016   Procedure: LAPAROSCOPIC GASTRIC SLEEVE RESECTION, UPPER ENDO;  Surgeon: Arta Bruce Kinsinger, MD;  Location: [facility];  Service: General;   Laterality: N/A;  . LYMPH NODE BIOPSY  1978   negative  . SIGMOIDOSCOPY    . TONSILLECTOMY AND ADENOIDECTOMY    . [place]   x2   Social History   Tobacco Use  . Smoking status: Former Smoker    Types: Cigarettes    Quit date: 09/17/1983    Years since quitting: 35.4  . Smokeless tobacco: Never Used  Substance Use Topics  . Alcohol use: No   family history includes Heart disease in his father.  Medications: Current Outpatient Medications  Medication Sig Dispense Refill  . allopurinol (ZYLOPRIM) 300 MG tablet Take 1 tablet (300 mg total) by mouth daily. 90 tablet 3  . aspirin 81 MG tablet Take 81 mg by mouth daily.    . calcium carbonate (TUMS - DOSED IN MG ELEMENTAL CALCIUM) 500 MG chewable tablet Chew 1 tablet by mouth 3 (three) times daily.    . cetirizine (ZYRTEC) 10 MG tablet Take 10 mg by mouth daily as needed for allergies.    . hydrochlorothiazide (MICROZIDE) 12.5 MG capsule Take 12.5 mg by mouth daily.    . metFORMIN (GLUCOPHAGE-XR) 750 MG 24 hr tablet Take 1 tablet (750 mg total) by mouth daily with breakfast. 90 tablet 1  . Multiple Vitamins-Minerals (BARIATRIC MULTIVITAMINS/IRON PO) Take 1 capsule by mouth 2 (two) times daily.    . phentermine 37.5 MG capsule One capsule by mouth qAM 30 capsule 0  . tamsulosin (FLOMAX) 0.4 MG CAPS capsule Take 1 capsule (0.4 mg total) by mouth at bedtime. 90 capsule 3   No current facility-administered medications for this visit.    No Known Allergies

## 2019-03-05 DIAGNOSIS — R351 Nocturia: Secondary | ICD-10-CM | POA: Diagnosis not present

## 2019-03-05 DIAGNOSIS — K625 Hemorrhage of anus and rectum: Secondary | ICD-10-CM | POA: Diagnosis not present

## 2019-03-05 DIAGNOSIS — E790 Hyperuricemia without signs of inflammatory arthritis and tophaceous disease: Secondary | ICD-10-CM | POA: Diagnosis not present

## 2019-03-05 DIAGNOSIS — R7303 Prediabetes: Secondary | ICD-10-CM | POA: Diagnosis not present

## 2019-03-06 LAB — LIPID PANEL W/REFLEX DIRECT LDL
Cholesterol: 193 mg/dL (ref <200)
HDL: 41 mg/dL (ref >=40)
LDL Cholesterol (Calc): 131 mg/dL (calc) — ABNORMAL HIGH
Non-HDL Cholesterol (Calc): 152 mg/dL (calc) — ABNORMAL HIGH (ref <130)
Total CHOL/HDL Ratio: 4.7 (calc) (ref <5.0)
Triglycerides: 106 mg/dL (ref <150)

## 2019-03-06 LAB — IRON,TIBC AND FERRITIN PANEL
%SAT: 24 % (calc) (ref 20–48)
Ferritin: 101 ng/mL (ref 38–380)
Iron: 89 ug/dL (ref 50–180)
TIBC: 370 mcg/dL (calc) (ref 250–425)

## 2019-03-06 LAB — HEMOGLOBIN A1C
Hgb A1c MFr Bld: 5.5 % of total Hgb (ref <5.7)
Mean Plasma Glucose: 111 (calc)
eAG (mmol/L): 6.2 (calc)

## 2019-03-06 LAB — URIC ACID: Uric Acid, Serum: 3.9 mg/dL — ABNORMAL LOW (ref 4.0–8.0)

## 2019-03-06 LAB — COMPLETE METABOLIC PANEL WITH GFR
AG Ratio: 2.1 (calc) (ref 1.0–2.5)
ALT: 15 U/L (ref 9–46)
AST: 16 U/L (ref 10–35)
Albumin: 4.4 g/dL (ref 3.6–5.1)
Alkaline phosphatase (APISO): 69 U/L (ref 35–144)
BUN/Creatinine Ratio: 27 (calc) — ABNORMAL HIGH (ref 6–22)
BUN: 28 mg/dL — ABNORMAL HIGH (ref 7–25)
CO2: 29 mmol/L (ref 20–32)
Calcium: 10 mg/dL (ref 8.6–10.3)
Chloride: 105 mmol/L (ref 98–110)
Creat: 1.02 mg/dL (ref 0.70–1.33)
GFR, Est African American: 93 mL/min/{1.73_m2} (ref >=60)
GFR, Est Non African American: 81 mL/min/{1.73_m2} (ref >=60)
Globulin: 2.1 g/dL (calc) (ref 1.9–3.7)
Glucose, Bld: 94 mg/dL (ref 65–139)
Potassium: 4.4 mmol/L (ref 3.5–5.3)
Sodium: 142 mmol/L (ref 135–146)
Total Bilirubin: 0.5 mg/dL (ref 0.2–1.2)
Total Protein: 6.5 g/dL (ref 6.1–8.1)

## 2019-03-06 LAB — CBC
HCT: 46 % (ref 38.5–50.0)
Hemoglobin: 15.1 g/dL (ref 13.2–17.1)
MCH: 28.2 pg (ref 27.0–33.0)
MCHC: 32.8 g/dL (ref 32.0–36.0)
MCV: 85.8 fL (ref 80.0–100.0)
MPV: 10.5 fL (ref 7.5–12.5)
Platelets: 272 10*3/uL (ref 140–400)
RBC: 5.36 10*6/uL (ref 4.20–5.80)
RDW: 13.3 % (ref 11.0–15.0)
WBC: 6 10*3/uL (ref 3.8–10.8)

## 2019-03-06 LAB — PSA: PSA: 4.1 ng/mL — ABNORMAL HIGH (ref <=4.0)

## 2019-04-03 ENCOUNTER — Encounter: Payer: Self-pay | Admitting: *Deleted

## 2019-04-09 ENCOUNTER — Other Ambulatory Visit: Payer: Self-pay

## 2019-04-09 ENCOUNTER — Ambulatory Visit (INDEPENDENT_AMBULATORY_CARE_PROVIDER_SITE_OTHER): Payer: BC Managed Care – PPO | Admitting: Internal Medicine

## 2019-04-09 ENCOUNTER — Encounter: Payer: Self-pay | Admitting: Internal Medicine

## 2019-04-09 VITALS — BP 142/92 | HR 88 | Temp 97.8°F | Ht 73.0 in | Wt 221.2 lb

## 2019-04-09 DIAGNOSIS — K5900 Constipation, unspecified: Secondary | ICD-10-CM | POA: Diagnosis not present

## 2019-04-09 DIAGNOSIS — Z86010 Personal history of colonic polyps: Secondary | ICD-10-CM

## 2019-04-09 DIAGNOSIS — K625 Hemorrhage of anus and rectum: Secondary | ICD-10-CM | POA: Diagnosis not present

## 2019-04-09 MED ORDER — SUPREP BOWEL PREP KIT 17.5-3.13-1.6 GM/177ML PO SOLN: 1.0000 | ORAL | 0 refills | Status: DC

## 2019-04-09 NOTE — Progress Notes (Signed)
Patient ID: Cory Reynolds, male   DOB: 06/10/1961, 58 y.o.   MRN: [id number] HPI: Cory Reynolds is a 58 year old male with a past medical history of adenomatous colon polyps, colonic diverticulosis, obesity status post gastric sleeve procedure in 2018, BPH and kidney stones who is seen in consult at the request of Dr. Georgina Snell to evaluate rectal bleeding.  He is here alone today.  He reports that he had gastric sleeve procedure in 2018 and has sustained a 90 pound weight loss.  He reports that he is experienced painless rectal bleeding.  He reports over the last few months about 75% of the time he will see a bright pink color on the toilet paper.  He estimates about 6 times he seen red blood in the toilet water and 1 or 2 times passed some small blood clots.  This bleeding is painless in nature.  He has noticed over the last few months in the same timeframe with bleeding he is noticed some hard stools, some less frequent stools though does not feel truly constipated.  He has required to strain some to pass bowel movement.  No abdominal pain.  No upper GI or hepatobiliary complaint.  He did try stool softeners and MiraLAX for about a week but did not tell that this helped his constipation significantly.  He had a colonoscopy which I performed on 07/24/2015.  This revealed 3 sessile polyps ranging from 3 to 5 mm.  These were located in the cecum, ascending colon and rectum.  There was mild diverticulosis in the left colon.  2 of these were adenomatous.  He had lab test done on 03/05/2019.  His metabolic panel was unremarkable.  His iron studies were normal.  His hemoglobin was 15.1 with an MCV of 85.8.  Past Medical History:  Diagnosis Date  . BPH (benign prostatic hyperplasia)   . Diverticulosis   . Kidney stones    followed by urology  . OSA (obstructive sleep apnea)    no CPAP   . Tubular adenoma of colon     Past Surgical History:  Procedure Laterality Date  . LAPAROSCOPIC GASTRIC SLEEVE RESECTION  N/A 12/06/2016   Procedure: LAPAROSCOPIC GASTRIC SLEEVE RESECTION, UPPER ENDO;  Surgeon: Arta Bruce Kinsinger, MD;  Location: [facility];  Service: General;  Laterality: N/A;  . LYMPH NODE BIOPSY  1978   negative  . SIGMOIDOSCOPY    . TONSILLECTOMY AND ADENOIDECTOMY    . [place]   x2    Outpatient Medications Prior to Visit  Medication Sig Dispense Refill  . allopurinol (ZYLOPRIM) 300 MG tablet Take 1 tablet (300 mg total) by mouth daily. 90 tablet 3  . aspirin 81 MG tablet Take 81 mg by mouth daily.    . cetirizine (ZYRTEC) 10 MG tablet Take 10 mg by mouth daily as needed for allergies.    . metFORMIN (GLUCOPHAGE-XR) 750 MG 24 hr tablet Take 1 tablet (750 mg total) by mouth daily with breakfast. 90 tablet 1  . Multiple Vitamins-Minerals (BARIATRIC MULTIVITAMINS/IRON PO) Take 1 capsule by mouth 2 (two) times daily.    . tamsulosin (FLOMAX) 0.4 MG CAPS capsule Take 1 capsule (0.4 mg total) by mouth at bedtime. 90 capsule 3  . calcium carbonate (TUMS - DOSED IN MG ELEMENTAL CALCIUM) 500 MG chewable tablet Chew 1 tablet by mouth 3 (three) times daily.    . hydrochlorothiazide (MICROZIDE) 12.5 MG capsule Take 12.5 mg by mouth daily.    . phentermine 37.5 MG capsule  One capsule by mouth qAM (Patient not taking: Reported on 04/09/2019) 30 capsule 0   No facility-administered medications prior to visit.     No Known Allergies  Family History  Problem Relation Age of Onset  . Heart disease Father   . Colon cancer Neg Hx   . Rectal cancer Neg Hx   . Stomach cancer Neg Hx     Social History   Tobacco Use  . Smoking status: Former Smoker    Types: Cigarettes    Quit date: 09/17/1983    Years since quitting: 35.5  . Smokeless tobacco: Never Used  Substance Use Topics  . Alcohol use: No  . Drug use: No    ROS: As per history of present illness, otherwise negative  BP (!) 142/92 (BP Location: Left Arm, Patient Position: Sitting, Cuff Size: Normal)   Pulse 88    Temp 97.8 F (36.6 C) (Other (Comment))   Ht 6\' 1"  (1.854 m)   Wt 221 lb 4 oz (100.4 kg)   BMI 29.19 kg/m  Gen: awake, alert, NAD HEENT: anicteric, op clear CV: RRR, no mrg Pulm: CTA b/l Abd: soft, NT/ND, +BS throughout Ext: no c/c/e Neuro: nonfocal   RELEVANT LABS AND IMAGING: CBC    Component Value Date/Time   WBC 6.0 03/05/2019 1626   RBC 5.36 03/05/2019 1626   HGB 15.1 03/05/2019 1626   HCT 46.0 03/05/2019 1626   PLT 272 03/05/2019 1626   MCV 85.8 03/05/2019 1626   MCH 28.2 03/05/2019 1626   MCHC 32.8 03/05/2019 1626   RDW 13.3 03/05/2019 1626   LYMPHSABS 1.0 12/08/2016 0543   MONOABS 1.1 (H) 12/08/2016 0543   EOSABS 0.2 12/08/2016 0543   BASOSABS 0.0 12/08/2016 0543    CMP     Component Value Date/Time   NA 142 03/05/2019 1626   K 4.4 03/05/2019 1626   CL 105 03/05/2019 1626   CO2 29 03/05/2019 1626   GLUCOSE 94 03/05/2019 1626   BUN 28 (H) 03/05/2019 1626   CREATININE 1.02 03/05/2019 1626   CALCIUM 10.0 03/05/2019 1626   PROT 6.5 03/05/2019 1626   ALBUMIN 3.9 12/07/2016 0501   AST 16 03/05/2019 1626   ALT 15 03/05/2019 1626   ALKPHOS 52 12/07/2016 0501   BILITOT 0.5 03/05/2019 1626   GFRNONAA 81 03/05/2019 1626   GFRAA 93 03/05/2019 1626    ASSESSMENT/PLAN: 58 year old male with a past medical history of adenomatous colon polyps, colonic diverticulosis, obesity status post gastric sleeve procedure in 2018, BPH and kidney stones who is seen in consult at the request of Dr. Georgina Snell to evaluate rectal bleeding.   1.  Rectal bleeding --my suspicion is for internal hemorrhoids, however given his history of adenomatous polyps I recommended we repeat colonoscopy at this time.  We discussed the risk, benefits and alternatives and he is agreeable and wishes to proceed.  Mild change in bowel habit with harder less frequent stool which would be exacerbating hemorrhoids should these be found.  2.  Mild constipation --I recommended he try MiraLAX 17 g again and keep  going for longer than a week.  This medication can take some time to become fully effective.  3.  Possible hemorrhoids --we will be evaluating the rectal bleeding as discussed in #1.  We discussed if hemorrhoids are found we could consider hemorrhoidal banding if bleeding is persistent or frequent.    [id number], Rebekah Chesterfield, Md [place] [address] [place],  [place] [address]

## 2019-04-09 NOTE — Patient Instructions (Addendum)
You have been scheduled for a colonoscopy. Please follow written instructions given to you at your visit today.  Please pick up your prep supplies at the pharmacy within the next 1-3 days. If you use inhalers (even only as needed), please bring them with you on the day of your procedure. Your physician has requested that you go to www.[email or web address].com and enter the access code given to you at your visit today. This web site gives a general overview about your procedure. However, you should still follow specific instructions given to you by our office regarding your preparation for the procedure.  HOLD METFORMIN THE MORNING OF YOUR TEST.  If you are age 62 or older, your body mass index should be between 23-30. Your Body mass index is 29.19 kg/m. If this is out of the aforementioned range listed, please consider follow up with your Primary Care Provider.  If you are age 39 or younger, your body mass index should be between 19-25. Your Body mass index is 29.19 kg/m. If this is out of the aformentioned range listed, please consider follow up with your Primary Care Provider.

## 2019-04-15 ENCOUNTER — Encounter: Payer: Self-pay | Admitting: Internal Medicine

## 2019-04-18 ENCOUNTER — Encounter: Payer: BC Managed Care – PPO | Admitting: Internal Medicine

## 2019-05-08 ENCOUNTER — Encounter: Payer: BC Managed Care – PPO | Admitting: Internal Medicine

## 2019-05-20 ENCOUNTER — Other Ambulatory Visit: Payer: Self-pay | Admitting: Family Medicine

## 2019-07-02 ENCOUNTER — Encounter (HOSPITAL_COMMUNITY): Payer: Self-pay

## 2019-11-06 ENCOUNTER — Other Ambulatory Visit: Payer: Self-pay | Admitting: Family Medicine

## 2019-11-06 NOTE — Telephone Encounter (Signed)
Needs appointment

## 2020-02-01 ENCOUNTER — Other Ambulatory Visit: Payer: Self-pay | Admitting: Sports Medicine

## 2020-02-15 DIAGNOSIS — N400 Enlarged prostate without lower urinary tract symptoms: Secondary | ICD-10-CM | POA: Diagnosis not present

## 2020-02-15 DIAGNOSIS — G4700 Insomnia, unspecified: Secondary | ICD-10-CM | POA: Diagnosis not present

## 2020-02-24 DIAGNOSIS — Z13220 Encounter for screening for lipoid disorders: Secondary | ICD-10-CM | POA: Diagnosis not present

## 2020-02-24 DIAGNOSIS — Z0000 Encounter for general adult medical examination without abnormal findings: Secondary | ICD-10-CM | POA: Diagnosis not present

## 2020-02-24 DIAGNOSIS — Z125 Encounter for screening for malignant neoplasm of prostate: Secondary | ICD-10-CM | POA: Diagnosis not present

## 2020-02-24 DIAGNOSIS — Z131 Encounter for screening for diabetes mellitus: Secondary | ICD-10-CM | POA: Diagnosis not present

## 2020-02-24 DIAGNOSIS — N401 Enlarged prostate with lower urinary tract symptoms: Secondary | ICD-10-CM | POA: Diagnosis not present

## 2020-02-24 DIAGNOSIS — Z1211 Encounter for screening for malignant neoplasm of colon: Secondary | ICD-10-CM | POA: Diagnosis not present

## 2020-02-24 DIAGNOSIS — Z1331 Encounter for screening for depression: Secondary | ICD-10-CM | POA: Diagnosis not present

## 2020-03-09 DIAGNOSIS — E611 Iron deficiency: Secondary | ICD-10-CM | POA: Diagnosis not present

## 2020-03-09 DIAGNOSIS — F4322 Adjustment disorder with anxiety: Secondary | ICD-10-CM | POA: Diagnosis not present

## 2020-03-09 DIAGNOSIS — N401 Enlarged prostate with lower urinary tract symptoms: Secondary | ICD-10-CM | POA: Diagnosis not present

## 2020-03-09 DIAGNOSIS — R634 Abnormal weight loss: Secondary | ICD-10-CM | POA: Diagnosis not present

## 2020-03-09 DIAGNOSIS — E538 Deficiency of other specified B group vitamins: Secondary | ICD-10-CM | POA: Diagnosis not present

## 2020-03-09 DIAGNOSIS — E559 Vitamin D deficiency, unspecified: Secondary | ICD-10-CM | POA: Diagnosis not present

## 2020-03-09 DIAGNOSIS — N200 Calculus of kidney: Secondary | ICD-10-CM | POA: Diagnosis not present

## 2020-03-09 DIAGNOSIS — R3916 Straining to void: Secondary | ICD-10-CM | POA: Diagnosis not present

## 2020-03-26 DIAGNOSIS — D485 Neoplasm of uncertain behavior of skin: Secondary | ICD-10-CM | POA: Diagnosis not present

## 2020-03-26 DIAGNOSIS — L578 Other skin changes due to chronic exposure to nonionizing radiation: Secondary | ICD-10-CM | POA: Diagnosis not present

## 2020-03-26 DIAGNOSIS — D225 Melanocytic nevi of trunk: Secondary | ICD-10-CM | POA: Diagnosis not present

## 2020-03-27 DIAGNOSIS — N401 Enlarged prostate with lower urinary tract symptoms: Secondary | ICD-10-CM | POA: Diagnosis not present

## 2020-03-27 DIAGNOSIS — R972 Elevated prostate specific antigen [PSA]: Secondary | ICD-10-CM | POA: Diagnosis not present

## 2020-03-27 DIAGNOSIS — Z87442 Personal history of urinary calculi: Secondary | ICD-10-CM | POA: Diagnosis not present

## 2020-04-06 DIAGNOSIS — Z01818 Encounter for other preprocedural examination: Secondary | ICD-10-CM | POA: Diagnosis not present

## 2020-04-08 DIAGNOSIS — Z6833 Body mass index (BMI) 33.0-33.9, adult: Secondary | ICD-10-CM | POA: Diagnosis not present

## 2020-04-08 DIAGNOSIS — F4322 Adjustment disorder with anxiety: Secondary | ICD-10-CM | POA: Diagnosis not present

## 2020-05-06 DIAGNOSIS — N401 Enlarged prostate with lower urinary tract symptoms: Secondary | ICD-10-CM | POA: Diagnosis not present

## 2020-05-06 DIAGNOSIS — R972 Elevated prostate specific antigen [PSA]: Secondary | ICD-10-CM | POA: Diagnosis not present

## 2020-05-07 DIAGNOSIS — R519 Headache, unspecified: Secondary | ICD-10-CM | POA: Diagnosis not present

## 2020-05-07 DIAGNOSIS — B9689 Other specified bacterial agents as the cause of diseases classified elsewhere: Secondary | ICD-10-CM | POA: Diagnosis not present

## 2020-05-07 DIAGNOSIS — J019 Acute sinusitis, unspecified: Secondary | ICD-10-CM | POA: Diagnosis not present

## 2020-05-21 DIAGNOSIS — Z6833 Body mass index (BMI) 33.0-33.9, adult: Secondary | ICD-10-CM | POA: Diagnosis not present

## 2020-05-21 DIAGNOSIS — F4322 Adjustment disorder with anxiety: Secondary | ICD-10-CM | POA: Diagnosis not present

## 2020-05-29 DIAGNOSIS — Z1211 Encounter for screening for malignant neoplasm of colon: Secondary | ICD-10-CM | POA: Diagnosis not present

## 2020-05-29 DIAGNOSIS — Z86010 Personal history of colonic polyps: Secondary | ICD-10-CM | POA: Diagnosis not present

## 2020-05-29 DIAGNOSIS — K5730 Diverticulosis of large intestine without perforation or abscess without bleeding: Secondary | ICD-10-CM | POA: Diagnosis not present

## 2020-05-29 DIAGNOSIS — Z09 Encounter for follow-up examination after completed treatment for conditions other than malignant neoplasm: Secondary | ICD-10-CM | POA: Diagnosis not present

## 2020-05-29 DIAGNOSIS — K648 Other hemorrhoids: Secondary | ICD-10-CM | POA: Diagnosis not present

## 2020-07-02 ENCOUNTER — Encounter (HOSPITAL_COMMUNITY): Payer: Self-pay

## 2020-07-24 DIAGNOSIS — Z6832 Body mass index (BMI) 32.0-32.9, adult: Secondary | ICD-10-CM | POA: Diagnosis not present

## 2020-07-24 DIAGNOSIS — J209 Acute bronchitis, unspecified: Secondary | ICD-10-CM | POA: Diagnosis not present

## 2020-08-18 DIAGNOSIS — Z6832 Body mass index (BMI) 32.0-32.9, adult: Secondary | ICD-10-CM | POA: Diagnosis not present

## 2020-08-18 DIAGNOSIS — J40 Bronchitis, not specified as acute or chronic: Secondary | ICD-10-CM | POA: Diagnosis not present

## 2020-08-18 DIAGNOSIS — R0981 Nasal congestion: Secondary | ICD-10-CM | POA: Diagnosis not present

## 2020-08-18 DIAGNOSIS — J329 Chronic sinusitis, unspecified: Secondary | ICD-10-CM | POA: Diagnosis not present

## 2020-09-10 DIAGNOSIS — N401 Enlarged prostate with lower urinary tract symptoms: Secondary | ICD-10-CM | POA: Diagnosis not present

## 2020-09-10 DIAGNOSIS — Z87442 Personal history of urinary calculi: Secondary | ICD-10-CM | POA: Diagnosis not present

## 2020-09-10 DIAGNOSIS — R972 Elevated prostate specific antigen [PSA]: Secondary | ICD-10-CM | POA: Diagnosis not present

## 2020-10-26 DIAGNOSIS — R3915 Urgency of urination: Secondary | ICD-10-CM | POA: Diagnosis not present

## 2020-10-26 DIAGNOSIS — R972 Elevated prostate specific antigen [PSA]: Secondary | ICD-10-CM | POA: Diagnosis not present

## 2020-10-26 DIAGNOSIS — E790 Hyperuricemia without signs of inflammatory arthritis and tophaceous disease: Secondary | ICD-10-CM | POA: Diagnosis not present

## 2020-10-26 DIAGNOSIS — N202 Calculus of kidney with calculus of ureter: Secondary | ICD-10-CM | POA: Diagnosis not present

## 2020-11-13 ENCOUNTER — Encounter: Payer: Self-pay | Admitting: Internal Medicine

## 2020-11-26 DIAGNOSIS — D075 Carcinoma in situ of prostate: Secondary | ICD-10-CM | POA: Diagnosis not present

## 2020-11-26 DIAGNOSIS — R972 Elevated prostate specific antigen [PSA]: Secondary | ICD-10-CM | POA: Diagnosis not present

## 2020-12-07 DIAGNOSIS — E790 Hyperuricemia without signs of inflammatory arthritis and tophaceous disease: Secondary | ICD-10-CM | POA: Diagnosis not present

## 2020-12-07 DIAGNOSIS — R3915 Urgency of urination: Secondary | ICD-10-CM | POA: Diagnosis not present

## 2020-12-07 DIAGNOSIS — N202 Calculus of kidney with calculus of ureter: Secondary | ICD-10-CM | POA: Diagnosis not present

## 2020-12-07 DIAGNOSIS — R972 Elevated prostate specific antigen [PSA]: Secondary | ICD-10-CM | POA: Diagnosis not present

## 2021-04-01 DIAGNOSIS — L578 Other skin changes due to chronic exposure to nonionizing radiation: Secondary | ICD-10-CM | POA: Diagnosis not present

## 2021-04-01 DIAGNOSIS — D485 Neoplasm of uncertain behavior of skin: Secondary | ICD-10-CM | POA: Diagnosis not present

## 2021-04-09 DIAGNOSIS — N400 Enlarged prostate without lower urinary tract symptoms: Secondary | ICD-10-CM | POA: Diagnosis not present

## 2021-04-09 DIAGNOSIS — F4322 Adjustment disorder with anxiety: Secondary | ICD-10-CM | POA: Diagnosis not present

## 2021-04-09 DIAGNOSIS — R972 Elevated prostate specific antigen [PSA]: Secondary | ICD-10-CM | POA: Diagnosis not present

## 2021-04-09 DIAGNOSIS — N200 Calculus of kidney: Secondary | ICD-10-CM | POA: Diagnosis not present

## 2021-07-11 DIAGNOSIS — J019 Acute sinusitis, unspecified: Secondary | ICD-10-CM | POA: Diagnosis not present

## 2021-07-11 DIAGNOSIS — R519 Headache, unspecified: Secondary | ICD-10-CM | POA: Diagnosis not present

## 2021-07-11 DIAGNOSIS — R059 Cough, unspecified: Secondary | ICD-10-CM | POA: Diagnosis not present

## 2021-07-11 DIAGNOSIS — R5383 Other fatigue: Secondary | ICD-10-CM | POA: Diagnosis not present

## 2021-08-27 DIAGNOSIS — J329 Chronic sinusitis, unspecified: Secondary | ICD-10-CM | POA: Diagnosis not present

## 2021-08-27 DIAGNOSIS — Z20828 Contact with and (suspected) exposure to other viral communicable diseases: Secondary | ICD-10-CM | POA: Diagnosis not present

## 2021-08-27 DIAGNOSIS — R051 Acute cough: Secondary | ICD-10-CM | POA: Diagnosis not present

## 2021-08-27 DIAGNOSIS — J40 Bronchitis, not specified as acute or chronic: Secondary | ICD-10-CM | POA: Diagnosis not present

## 2021-09-17 DIAGNOSIS — J40 Bronchitis, not specified as acute or chronic: Secondary | ICD-10-CM | POA: Diagnosis not present

## 2021-09-17 DIAGNOSIS — Z6834 Body mass index (BMI) 34.0-34.9, adult: Secondary | ICD-10-CM | POA: Diagnosis not present

## 2021-09-17 DIAGNOSIS — J329 Chronic sinusitis, unspecified: Secondary | ICD-10-CM | POA: Diagnosis not present

## 2021-12-08 DIAGNOSIS — J029 Acute pharyngitis, unspecified: Secondary | ICD-10-CM | POA: Diagnosis not present

## 2021-12-08 DIAGNOSIS — Z20828 Contact with and (suspected) exposure to other viral communicable diseases: Secondary | ICD-10-CM | POA: Diagnosis not present

## 2021-12-08 DIAGNOSIS — R519 Headache, unspecified: Secondary | ICD-10-CM | POA: Diagnosis not present

## 2021-12-08 DIAGNOSIS — R051 Acute cough: Secondary | ICD-10-CM | POA: Diagnosis not present

## 2022-01-18 DIAGNOSIS — H35372 Puckering of macula, left eye: Secondary | ICD-10-CM | POA: Diagnosis not present

## 2022-01-21 DIAGNOSIS — R972 Elevated prostate specific antigen [PSA]: Secondary | ICD-10-CM | POA: Diagnosis not present

## 2022-01-21 DIAGNOSIS — Z13220 Encounter for screening for lipoid disorders: Secondary | ICD-10-CM | POA: Diagnosis not present

## 2022-01-21 DIAGNOSIS — Z131 Encounter for screening for diabetes mellitus: Secondary | ICD-10-CM | POA: Diagnosis not present

## 2022-01-21 DIAGNOSIS — N400 Enlarged prostate without lower urinary tract symptoms: Secondary | ICD-10-CM | POA: Diagnosis not present

## 2022-01-21 DIAGNOSIS — K912 Postsurgical malabsorption, not elsewhere classified: Secondary | ICD-10-CM | POA: Diagnosis not present

## 2022-01-21 DIAGNOSIS — F4322 Adjustment disorder with anxiety: Secondary | ICD-10-CM | POA: Diagnosis not present

## 2022-01-21 DIAGNOSIS — Z125 Encounter for screening for malignant neoplasm of prostate: Secondary | ICD-10-CM | POA: Diagnosis not present

## 2022-01-21 DIAGNOSIS — Z0000 Encounter for general adult medical examination without abnormal findings: Secondary | ICD-10-CM | POA: Diagnosis not present

## 2022-01-24 DIAGNOSIS — K912 Postsurgical malabsorption, not elsewhere classified: Secondary | ICD-10-CM | POA: Diagnosis not present

## 2022-03-28 ENCOUNTER — Ambulatory Visit (INDEPENDENT_AMBULATORY_CARE_PROVIDER_SITE_OTHER): Payer: BC Managed Care – PPO

## 2022-03-28 ENCOUNTER — Encounter: Payer: Self-pay | Admitting: Orthopedic Surgery

## 2022-03-28 ENCOUNTER — Ambulatory Visit: Payer: BC Managed Care – PPO | Admitting: Orthopedic Surgery

## 2022-03-28 DIAGNOSIS — M25562 Pain in left knee: Secondary | ICD-10-CM | POA: Diagnosis not present
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
## 2022-04-11 ENCOUNTER — Encounter: Payer: Self-pay | Admitting: Orthopedic Surgery

## 2022-04-11 ENCOUNTER — Ambulatory Visit: Payer: BC Managed Care – PPO | Admitting: Orthopedic Surgery

## 2022-04-11 DIAGNOSIS — M25462 Effusion, left knee: Secondary | ICD-10-CM | POA: Diagnosis not present

## 2022-04-11 MED ORDER — LIDOCAINE HCL 1 % IJ SOLN
5.0000 mL | INTRAMUSCULAR | Status: AC | PRN
  Administered 2022-04-11: 5 mL

## 2022-04-11 MED ORDER — BUPIVACAINE HCL 0.25 % IJ SOLN
4.0000 mL | INTRAMUSCULAR | Status: AC | PRN
  Administered 2022-04-11: 4 mL via INTRA_ARTICULAR

## 2022-04-11 MED ORDER — METHYLPREDNISOLONE ACETATE 40 MG/ML IJ SUSP
40.0000 mg | INTRAMUSCULAR | Status: AC | PRN
  Administered 2022-04-11: 40 mg via INTRA_ARTICULAR

## 2022-04-11 NOTE — Progress Notes (Signed)
Office Visit Note   Patient: Cory Reynolds           Date of Birth: 03-25-1961           MRN: [id number] Visit Date: 04/11/2022 Requested by: Serita Grammes, MD [address] [place],  [place] [address] PCP: Serita Grammes, MD  Subjective: Chief Complaint  Patient presents with   Left Knee - Pain    HPI: Cory Reynolds is a 61 year old patient with left knee pain.  Had date of injury 03/19/2019.  Is having some increased pain and some stiffness.  Overall he has not had any mechanical symptoms but still reports medial sided pain.  Still is limping and he states that he is tired of limping              ROS: All systems reviewed are negative as they relate to the chief complaint within the history of present illness.  Patient denies  fevers or chills.   Assessment & Plan: Visit Diagnoses:  1. Effusion, left knee     Plan: Impression is left knee pain with probable meniscal tear.  Plan is aspiration and injection today.  If he does not improve significantly over the next week we will proceed with MRI scanning due to failure of conservative management and high likelihood of medial meniscal pathology.  Follow-up as needed.  Follow-Up Instructions: No follow-ups on file.   Orders:  No orders of the defined types were placed in this encounter.  No orders of the defined types were placed in this encounter.     Procedures: Large Joint Inj: L knee on 04/11/2022 10:01 PM Indications: diagnostic evaluation, joint swelling and pain Details: 18 G 1.5 in needle, superolateral approach  Arthrogram: No  Medications: 5 mL lidocaine 1 %; 40 mg methylPREDNISolone acetate 40 MG/ML; 4 mL bupivacaine 0.25 % Outcome: tolerated well, no immediate complications Procedure, treatment alternatives, risks and benefits explained, specific risks discussed. Consent was given by the patient. Immediately prior to procedure a time out was called to verify the correct patient, procedure, equipment, support  staff and site/side marked as required. Patient was prepped and draped in the usual sterile fashion.       Clinical Data: No additional findings.  Objective: Vital Signs: There were no vitals taken for this visit.  Physical Exam:   Constitutional: Patient appears well-developed HEENT:  Head: Normocephalic Eyes:EOM are normal Neck: Normal range of motion Cardiovascular: Normal rate Pulmonary/chest: Effort normal Neurologic: Patient is alert Skin: Skin is warm Psychiatric: Patient has normal mood and affect   Ortho Exam: Ortho exam demonstrates mild pain with full extension.  Medial joint line tenderness is present on the left.  Trace effusion present.  Extensor mechanism intact.  No groin pain on the left with internal/external rotation of the leg.  No other masses lymphadenopathy or skin changes noted in that leg region.  Specialty Comments:  No specialty comments available.  Imaging: No results found.   PMFS History: Patient Active Problem List   Diagnosis Date Noted   Elevated uric acid in blood 05/23/2018   BMI 30.0-30.9,adult 05/23/2018   Bariatric surgery status 05/23/2018   BPH (benign prostatic hyperplasia) 05/23/2018   ED (erectile dysfunction) 11/22/2016   Vitamin D deficiency 03/07/2016   Prediabetes 03/07/2016   Benign fibroma of prostate 09/14/2011   Calculus of kidney 09/14/2011   Past Medical History:  Diagnosis Date   BPH (benign prostatic hyperplasia)    Diverticulosis    Kidney stones    followed  by urology   OSA (obstructive sleep apnea)    no CPAP    Tubular adenoma of colon     Family History  Problem Relation Age of Onset   Heart disease Father    Colon cancer Neg Hx    Rectal cancer Neg Hx    Stomach cancer Neg Hx     Past Surgical History:  Procedure Laterality Date   LAPAROSCOPIC GASTRIC SLEEVE RESECTION N/A 12/06/2016   Procedure: LAPAROSCOPIC GASTRIC SLEEVE RESECTION, UPPER ENDO;  Surgeon: Arta Bruce Kinsinger, MD;   Location: [facility];  Service: General;  Laterality: N/A;   LYMPH NODE BIOPSY  1978   negative   [place]   x2   Social History   Occupational History   Not on file  Tobacco Use   Smoking status: Former    Types: Cigarettes    Quit date: 09/17/1983    Years since quitting: 38.5   Smokeless tobacco: Never  Substance and Sexual Activity   Alcohol use: No   Drug use: No   Sexual activity: Not on file

## 2022-05-11 ENCOUNTER — Ambulatory Visit: Payer: BC Managed Care – PPO | Admitting: Orthopedic Surgery

## 2022-05-23 ENCOUNTER — Ambulatory Visit: Payer: BC Managed Care – PPO | Admitting: Orthopedic Surgery

## 2022-05-23 DIAGNOSIS — M25462 Effusion, left knee: Secondary | ICD-10-CM | POA: Diagnosis not present

## 2022-05-29 ENCOUNTER — Encounter: Payer: Self-pay | Admitting: Orthopedic Surgery

## 2022-05-29 NOTE — Progress Notes (Signed)
Office Visit Note   Patient: Cory Reynolds           Date of Birth: Jul 19, 1961           MRN: [id number] Visit Date: 05/23/2022 Requested by: Serita Grammes, MD [address] [place],  [place] [address] PCP: Serita Grammes, MD  Subjective: Chief Complaint  Patient presents with   Left Knee - Follow-up    DOI 03/18/2022    HPI: Cory Reynolds is a 61 y.o. male who presents to the office reporting left knee pain.  He had a left knee injection which gave him relief for only 3 days.  Clinical suspicion at that time for medial meniscal pathology.  Reports some burning pain around the patella as well as the left medial joint line.  Pain is some better but he still reports a constant toothache type pain with some sporadic mechanical symptoms.  He runs a [employer].  Has to do a lot of activity..                ROS: All systems reviewed are negative as they relate to the chief complaint within the history of present illness.  Patient denies fevers or chills.  Assessment & Plan: Visit Diagnoses:  1. Effusion, left knee     Plan: Impression is left knee medial meniscal tear.  Failure of conservative treatment with more than 6 weeks of duration of symptoms.  Plan left knee MRI scan for further evaluation and management.  High likelihood of medial meniscal pathology.  Slight effusion present today.  Follow-Up Instructions: No follow-ups on file.   Orders:  Orders Placed This Encounter  Procedures   MR Knee Left w/o contrast   No orders of the defined types were placed in this encounter.     Procedures: No procedures performed   Clinical Data: No additional findings.  Objective: Vital Signs: There were no vitals taken for this visit.  Physical Exam:  Constitutional: Patient appears well-developed HEENT:  Head: Normocephalic Eyes:EOM are normal Neck: Normal range of motion Cardiovascular: Normal rate Pulmonary/chest: Effort normal Neurologic: Patient is  alert Skin: Skin is warm Psychiatric: Patient has normal mood and affect  Ortho Exam: Ortho exam demonstrates excellent range of motion of the left knee.  Has medial joint line tenderness with positive Murray compression testing.  Collateral cruciate ligaments are stable.  Pedal pulse palpable.  No groin pain on the right with internal/external rotation of the leg.  Specialty Comments:  No specialty comments available.  Imaging: No results found.   PMFS History: Patient Active Problem List   Diagnosis Date Noted   Elevated uric acid in blood 05/23/2018   BMI 30.0-30.9,adult 05/23/2018   Bariatric surgery status 05/23/2018   BPH (benign prostatic hyperplasia) 05/23/2018   ED (erectile dysfunction) 11/22/2016   Vitamin D deficiency 03/07/2016   Prediabetes 03/07/2016   Benign fibroma of prostate 09/14/2011   Calculus of kidney 09/14/2011   Past Medical History:  Diagnosis Date   BPH (benign prostatic hyperplasia)    Diverticulosis    Kidney stones    followed by urology   OSA (obstructive sleep apnea)    no CPAP    Tubular adenoma of colon     Family History  Problem Relation Age of Onset   Heart disease Father    Colon cancer Neg Hx    Rectal cancer Neg Hx    Stomach cancer Neg Hx     Past Surgical History:  Procedure Laterality Date  LAPAROSCOPIC GASTRIC SLEEVE RESECTION N/A 12/06/2016   Procedure: LAPAROSCOPIC GASTRIC SLEEVE RESECTION, UPPER ENDO;  Surgeon: Arta Bruce Kinsinger, MD;  Location: [facility];  Service: General;  Laterality: N/A;   LYMPH NODE BIOPSY  1978   negative   [place]   x2   Social History   Occupational History   Not on file  Tobacco Use   Smoking status: Former    Types: Cigarettes    Quit date: 09/17/1983    Years since quitting: 38.7   Smokeless tobacco: Never  Substance and Sexual Activity   Alcohol use: No   Drug use: No   Sexual activity: Not on file

## 2022-06-09 ENCOUNTER — Ambulatory Visit
Admission: RE | Admit: 2022-06-09 | Discharge: 2022-06-09 | Disposition: A | Discharge status: Home / Self Care | Payer: BC Managed Care – PPO | Source: Ambulatory Visit | Attending: Orthopedic Surgery | Admitting: Orthopedic Surgery

## 2022-06-09 DIAGNOSIS — M25462 Effusion, left knee: Secondary | ICD-10-CM

## 2022-06-09 DIAGNOSIS — S8992XA Unspecified injury of left lower leg, initial encounter: Secondary | ICD-10-CM | POA: Diagnosis not present

## 2022-06-09 DIAGNOSIS — M1712 Unilateral primary osteoarthritis, left knee: Secondary | ICD-10-CM | POA: Diagnosis not present

## 2022-06-13 ENCOUNTER — Ambulatory Visit: Payer: BC Managed Care – PPO | Admitting: Orthopedic Surgery

## 2022-06-15 ENCOUNTER — Ambulatory Visit: Payer: BC Managed Care – PPO | Admitting: Orthopedic Surgery

## 2022-06-15 DIAGNOSIS — M25462 Effusion, left knee: Secondary | ICD-10-CM

## 2022-06-16 ENCOUNTER — Encounter: Payer: Self-pay | Admitting: Orthopedic Surgery

## 2022-06-16 NOTE — Progress Notes (Signed)
Office Visit Note   Patient: Cory Reynolds           Date of Birth: Sep 01, 1960           MRN: [id number] Visit Date: 06/15/2022 Requested by: Serita Grammes, MD [address] [place],  [place] [address] PCP: Serita Grammes, MD  Subjective: Chief Complaint  Patient presents with   Left Knee - Pain    HPI: Cory Reynolds is a 61 y.o. male who presents to the office reporting left knee pain.  He states he has significantly less knee pain now.  Does report a little bit of pain in the superior aspect of his patella with full extension.  MRI scan is reviewed with the patient.  Shows intact menisci and grade 2 subacute injury to the MCL.  Mild degenerative changes..                ROS: All systems reviewed are negative as they relate to the chief complaint within the history of present illness.  Patient denies fevers or chills.  Assessment & Plan: Visit Diagnoses: No diagnosis found.  Plan: Impression is grade 2 MCL injury with stable knee no effusion and no indication for operative intervention.  This should be a self-limited problem.  Continue with nonweightbearing quad strengthening exercises and follow-up as needed.  No source of effusion identified.  Follow-Up Instructions: No follow-ups on file.   Orders:  No orders of the defined types were placed in this encounter.  No orders of the defined types were placed in this encounter.     Procedures: No procedures performed   Clinical Data: No additional findings.  Objective: Vital Signs: There were no vitals taken for this visit.  Physical Exam:  Constitutional: Patient appears well-developed HEENT:  Head: Normocephalic Eyes:EOM are normal Neck: Normal range of motion Cardiovascular: Normal rate Pulmonary/chest: Effort normal Neurologic: Patient is alert Skin: Skin is warm Psychiatric: Patient has normal mood and affect  Ortho Exam: Ortho exam demonstrates normal gait alignment.  No effusion in the right knee.   Range of motion is full.  Fairly minimal tenderness over the MCL and the LCL.  No joint line tenderness is present.  Extensor mechanism intact.  No masses lymphadenopathy or skin changes noted in that right knee region.  Specialty Comments:  No specialty comments available.  Imaging: No results found.   PMFS History: Patient Active Problem List   Diagnosis Date Noted   Elevated uric acid in blood 05/23/2018   BMI 30.0-30.9,adult 05/23/2018   Bariatric surgery status 05/23/2018   BPH (benign prostatic hyperplasia) 05/23/2018   ED (erectile dysfunction) 11/22/2016   Vitamin D deficiency 03/07/2016   Prediabetes 03/07/2016   Benign fibroma of prostate 09/14/2011   Calculus of kidney 09/14/2011   Past Medical History:  Diagnosis Date   BPH (benign prostatic hyperplasia)    Diverticulosis    Kidney stones    followed by urology   OSA (obstructive sleep apnea)    no CPAP    Tubular adenoma of colon     Family History  Problem Relation Age of Onset   Heart disease Father    Colon cancer Neg Hx    Rectal cancer Neg Hx    Stomach cancer Neg Hx     Past Surgical History:  Procedure Laterality Date   LAPAROSCOPIC GASTRIC SLEEVE RESECTION N/A 12/06/2016   Procedure: LAPAROSCOPIC GASTRIC SLEEVE RESECTION, UPPER ENDO;  Surgeon: Arta Bruce Kinsinger, MD;  Location: [facility];  Service: General;  Laterality: N/A;   LYMPH NODE BIOPSY  1978   negative   SIGMOIDOSCOPY     TONSILLECTOMY AND ADENOIDECTOMY     UMBILICAL HERNIA REPAIR  1995   x2   Social History   Occupational History   Not on file  Tobacco Use   Smoking status: Former    Types: Cigarettes    Quit date: 09/17/1983    Years since quitting: 38.7   Smokeless tobacco: Never  Substance and Sexual Activity   Alcohol use: No   Drug use: No   Sexual activity: Not on file

## 2022-06-23 DIAGNOSIS — R972 Elevated prostate specific antigen [PSA]: Secondary | ICD-10-CM | POA: Diagnosis not present

## 2022-06-23 DIAGNOSIS — N400 Enlarged prostate without lower urinary tract symptoms: Secondary | ICD-10-CM | POA: Diagnosis not present

## 2022-06-23 DIAGNOSIS — Z23 Encounter for immunization: Secondary | ICD-10-CM | POA: Diagnosis not present

## 2022-06-23 DIAGNOSIS — H65193 Other acute nonsuppurative otitis media, bilateral: Secondary | ICD-10-CM | POA: Diagnosis not present

## 2022-07-12 DIAGNOSIS — H6993 Unspecified Eustachian tube disorder, bilateral: Secondary | ICD-10-CM | POA: Diagnosis not present

## 2022-07-12 DIAGNOSIS — Z6837 Body mass index (BMI) 37.0-37.9, adult: Secondary | ICD-10-CM | POA: Diagnosis not present

## 2022-07-12 DIAGNOSIS — H60599 Other noninfective acute otitis externa, unspecified ear: Secondary | ICD-10-CM | POA: Diagnosis not present

## 2022-08-30 DIAGNOSIS — L821 Other seborrheic keratosis: Secondary | ICD-10-CM | POA: Diagnosis not present

## 2022-08-30 DIAGNOSIS — L578 Other skin changes due to chronic exposure to nonionizing radiation: Secondary | ICD-10-CM | POA: Diagnosis not present

## 2022-08-30 DIAGNOSIS — D2239 Melanocytic nevi of other parts of face: Secondary | ICD-10-CM | POA: Diagnosis not present

## 2022-08-30 DIAGNOSIS — L820 Inflamed seborrheic keratosis: Secondary | ICD-10-CM | POA: Diagnosis not present

## 2022-10-06 DIAGNOSIS — J309 Allergic rhinitis, unspecified: Secondary | ICD-10-CM | POA: Diagnosis not present

## 2022-10-06 DIAGNOSIS — J029 Acute pharyngitis, unspecified: Secondary | ICD-10-CM | POA: Diagnosis not present

## 2022-10-06 DIAGNOSIS — Z6836 Body mass index (BMI) 36.0-36.9, adult: Secondary | ICD-10-CM | POA: Diagnosis not present

## 2022-11-01 DIAGNOSIS — N5201 Erectile dysfunction due to arterial insufficiency: Secondary | ICD-10-CM | POA: Diagnosis not present

## 2022-11-01 DIAGNOSIS — R972 Elevated prostate specific antigen [PSA]: Secondary | ICD-10-CM | POA: Diagnosis not present

## 2022-11-01 DIAGNOSIS — E790 Hyperuricemia without signs of inflammatory arthritis and tophaceous disease: Secondary | ICD-10-CM | POA: Diagnosis not present

## 2022-11-01 DIAGNOSIS — N202 Calculus of kidney with calculus of ureter: Secondary | ICD-10-CM | POA: Diagnosis not present

## 2022-11-09 DIAGNOSIS — H35372 Puckering of macula, left eye: Secondary | ICD-10-CM | POA: Diagnosis not present

## 2022-11-25 DIAGNOSIS — H43813 Vitreous degeneration, bilateral: Secondary | ICD-10-CM | POA: Diagnosis not present

## 2022-11-25 DIAGNOSIS — H2513 Age-related nuclear cataract, bilateral: Secondary | ICD-10-CM | POA: Diagnosis not present

## 2022-11-25 DIAGNOSIS — H35372 Puckering of macula, left eye: Secondary | ICD-10-CM | POA: Diagnosis not present

## 2022-12-14 DIAGNOSIS — H65192 Other acute nonsuppurative otitis media, left ear: Secondary | ICD-10-CM | POA: Diagnosis not present

## 2022-12-14 DIAGNOSIS — R070 Pain in throat: Secondary | ICD-10-CM | POA: Diagnosis not present

## 2022-12-14 DIAGNOSIS — Z6836 Body mass index (BMI) 36.0-36.9, adult: Secondary | ICD-10-CM | POA: Diagnosis not present

## 2023-01-23 DIAGNOSIS — Z0000 Encounter for general adult medical examination without abnormal findings: Secondary | ICD-10-CM | POA: Diagnosis not present

## 2023-01-23 DIAGNOSIS — R0609 Other forms of dyspnea: Secondary | ICD-10-CM | POA: Diagnosis not present

## 2023-01-23 DIAGNOSIS — Z1331 Encounter for screening for depression: Secondary | ICD-10-CM | POA: Diagnosis not present

## 2023-01-23 DIAGNOSIS — Z131 Encounter for screening for diabetes mellitus: Secondary | ICD-10-CM | POA: Diagnosis not present

## 2023-01-23 DIAGNOSIS — E782 Mixed hyperlipidemia: Secondary | ICD-10-CM | POA: Diagnosis not present

## 2023-01-23 DIAGNOSIS — E785 Hyperlipidemia, unspecified: Secondary | ICD-10-CM | POA: Diagnosis not present

## 2023-02-21 DIAGNOSIS — H2513 Age-related nuclear cataract, bilateral: Secondary | ICD-10-CM | POA: Diagnosis not present

## 2023-02-21 DIAGNOSIS — H18413 Arcus senilis, bilateral: Secondary | ICD-10-CM | POA: Diagnosis not present

## 2023-02-21 DIAGNOSIS — H35372 Puckering of macula, left eye: Secondary | ICD-10-CM | POA: Diagnosis not present

## 2023-02-21 DIAGNOSIS — H25043 Posterior subcapsular polar age-related cataract, bilateral: Secondary | ICD-10-CM | POA: Diagnosis not present

## 2023-02-21 DIAGNOSIS — H2512 Age-related nuclear cataract, left eye: Secondary | ICD-10-CM | POA: Diagnosis not present

## 2023-03-06 DIAGNOSIS — H35372 Puckering of macula, left eye: Secondary | ICD-10-CM | POA: Diagnosis not present

## 2023-03-06 DIAGNOSIS — H3581 Retinal edema: Secondary | ICD-10-CM | POA: Diagnosis not present

## 2023-03-06 DIAGNOSIS — H2512 Age-related nuclear cataract, left eye: Secondary | ICD-10-CM | POA: Diagnosis not present

## 2023-03-07 DIAGNOSIS — H2512 Age-related nuclear cataract, left eye: Secondary | ICD-10-CM | POA: Diagnosis not present

## 2023-03-07 DIAGNOSIS — H3581 Retinal edema: Secondary | ICD-10-CM | POA: Diagnosis not present

## 2023-03-07 DIAGNOSIS — Z9889 Other specified postprocedural states: Secondary | ICD-10-CM | POA: Diagnosis not present

## 2023-03-07 DIAGNOSIS — H35372 Puckering of macula, left eye: Secondary | ICD-10-CM | POA: Diagnosis not present

## 2023-03-10 DIAGNOSIS — R972 Elevated prostate specific antigen [PSA]: Secondary | ICD-10-CM | POA: Diagnosis not present

## 2023-03-10 DIAGNOSIS — N400 Enlarged prostate without lower urinary tract symptoms: Secondary | ICD-10-CM | POA: Diagnosis not present

## 2023-03-10 DIAGNOSIS — F4322 Adjustment disorder with anxiety: Secondary | ICD-10-CM | POA: Diagnosis not present

## 2023-03-10 DIAGNOSIS — Z6835 Body mass index (BMI) 35.0-35.9, adult: Secondary | ICD-10-CM | POA: Diagnosis not present

## 2023-03-14 DIAGNOSIS — H3581 Retinal edema: Secondary | ICD-10-CM | POA: Diagnosis not present

## 2023-03-14 DIAGNOSIS — H35372 Puckering of macula, left eye: Secondary | ICD-10-CM | POA: Diagnosis not present

## 2023-03-14 NOTE — Progress Notes (Unsigned)
Cardiology Office Note:   Date:  03/16/2023  NAME:  Cory Reynolds    MRN: [id number] DOB:  1961/06/12   PCP:  Buckner Malta, MD  Cardiologist:  Reatha Harps, MD  Electrophysiologist:  None   Referring MD: Buckner Malta, MD   Chief Complaint  Patient presents with   Shortness of Breath         History of Present Illness:   Cory Reynolds is a 62 y.o. male with a hx of BPH who is being seen today for the evaluation of SOB at the request of Buckner Malta, MD. he reports he is in between jobs.  Has had chest discomfort and shortness of breath the past 3 months.  Describes a pinch in his chest.  Occurs with activity.  As well as shortness of breath.  Symptoms resolved with cessation of activity.  He does not smoke.  No alcohol or drug use reported.  Medical history significant for obesity.  Does not have high blood pressure.  Does have BPH.  This is controlled.  He is not diabetic.  He has personally never had a heart attack or stroke.  He does have a family history of heart disease in his father.  Lipids within limits.  CV exam unremarkable.  EKG shows a single PVC but no symptoms from this.  We did discuss coronary CTA and echocardiogram for further evaluation.  He is married.  He has 2 children.  He has several grandchildren.  Symptoms are ongoing.  Concerning for possible cardiac etiology.  A1c 5.9 HGB 14.8 T chol 205, TG 152, HDL 37, LDL 138  Past Medical History: Past Medical History:  Diagnosis Date   BPH (benign prostatic hyperplasia)    BPH (benign prostatic hyperplasia)    Diverticulosis    Kidney stones    followed by urology   OSA (obstructive sleep apnea)    no CPAP    Tubular adenoma of colon     Past Surgical History: Past Surgical History:  Procedure Laterality Date   LAPAROSCOPIC GASTRIC SLEEVE RESECTION N/A 12/06/2016   Procedure: LAPAROSCOPIC GASTRIC SLEEVE RESECTION, UPPER ENDO;  Surgeon: De Blanch Kinsinger, MD;  Location: [facility];  Service:  General;  Laterality: N/A;   LYMPH NODE BIOPSY  08/15/1976   negative   SIGMOIDOSCOPY     TONSILLECTOMY AND ADENOIDECTOMY     UMBILICAL HERNIA REPAIR  08/15/1993   x2    Current Medications: Current Meds  Medication Sig   cetirizine (ZYRTEC) 10 MG tablet Take 10 mg by mouth daily as needed for allergies.   ESCITALOPRAM OXALATE PO Take by mouth at bedtime.   metoprolol tartrate (LOPRESSOR) 100 MG tablet Take 2 hours prior to CT   Multiple Vitamins-Minerals (BARIATRIC MULTIVITAMINS/IRON PO) Take 1 capsule by mouth 2 (two) times daily.   tamsulosin (FLOMAX) 0.4 MG CAPS capsule Take 1 capsule (0.4 mg total) by mouth at bedtime. NEEDS FOLLOW UP APPOINTMENT   TRAZODONE HCL PO Take by mouth at bedtime.     Allergies:    Patient has no known allergies.   Social History: Social History   Socioeconomic History   Marital status: Married    Spouse name: Not on file   Number of children: 2   Years of education: Not on file   Highest education level: Not on file  Occupational History   Occupation: Factory Art therapist  Tobacco Use   Smoking status: Former    Current packs/day: 0.00    Types: Cigarettes  Quit date: 09/17/1983    Years since quitting: 39.5   Smokeless tobacco: Never  Substance and Sexual Activity   Alcohol use: No   Drug use: No   Sexual activity: Not on file  Other Topics Concern   Not on file  Social History Narrative   Not on file   Social Determinants of Health   Financial Resource Strain: Not on file  Food Insecurity: Not on file  Transportation Needs: Not on file  Physical Activity: Insufficiently Active (05/23/2018)   Exercise Vital Sign    Days of Exercise per Week: 4 days    Minutes of Exercise per Session: 30 min  Stress: Not on file  Social Connections: Not on file     Family History: The patient's family history includes Heart disease in his father. There is no history of Colon cancer, Rectal cancer, or Stomach cancer.  ROS:   All  other ROS reviewed and negative. Pertinent positives noted in the HPI.     EKGs/Labs/Other Studies Reviewed:   The following studies were personally reviewed by me today:  EKG:  EKG is ordered today.    EKG Interpretation Date/Time:  Thursday March 16 2023 16:06:51 EDT Ventricular Rate:  67 PR Interval:  182 QRS Duration:  102 QT Interval:  414 QTC Calculation: 437 R Axis:   8  Text Interpretation: Sinus rhythm with occasional Premature ventricular complexes Confirmed by Lennie Odor [phone]) on 03/16/2023 4:11:03 PM   Recent Labs: No results found for requested labs within last 365 days.   Recent Lipid Panel    Component Value Date/Time   CHOL 193 03/05/2019 1626   TRIG 106 03/05/2019 1626   HDL 41 03/05/2019 1626   CHOLHDL 4.7 03/05/2019 1626   VLDL 24 03/04/2016 0809   LDLCALC 131 (H) 03/05/2019 1626    Physical Exam:   VS:  BP 114/78 (BP Location: Left Arm, Patient Position: Sitting, Cuff Size: Normal)   Pulse 67   Ht 6\' 1"  (1.854 m)   Wt 272 lb 6.4 oz (123.6 kg)   SpO2 94%   BMI 35.94 kg/m    Wt Readings from Last 3 Encounters:  03/16/23 272 lb 6.4 oz (123.6 kg)  04/09/19 221 lb 4 oz (100.4 kg)  02/22/19 222 lb (100.7 kg)    General: Well nourished, well developed, in no acute distress Head: Atraumatic, normal size  Eyes: PEERLA, EOMI  Neck: Supple, no JVD Endocrine: No thryomegaly Cardiac: Normal S1, S2; RRR; no murmurs, rubs, or gallops Lungs: Clear to auscultation bilaterally, no wheezing, rhonchi or rales  Abd: Soft, nontender, no hepatomegaly  Ext: No edema, pulses 2+ Musculoskeletal: No deformities, BUE and BLE strength normal and equal Skin: Warm and dry, no rashes   Neuro: Alert and oriented to person, place, time, and situation, CNII-XII grossly intact, no focal deficits  Psych: Normal mood and affect   ASSESSMENT:   Cory Reynolds is a 62 y.o. male who presents for the following: 1. Precordial pain   2. SOB (shortness of breath) on exertion    3. Pre-procedure lab exam     PLAN:   1. Precordial pain 2. SOB (shortness of breath) on exertion 3. Pre-procedure lab exam -Symptoms of exertional chest discomfort and shortness of breath for the past 3 months.  EKG is nonischemic.  CV exam is normal.  He does have a family history of heart disease.  CV risk factors include obesity and hyperlipidemia.  We will proceed with coronary CTA for further evaluation.  BMP today.  100 mg metoprolol tartrate charge before the scan.  We will also complete an echo.  Further follow-up will be dictated by the results of this test.   Disposition: Return if symptoms worsen or fail to improve.  Medication Adjustments/Labs and Tests Ordered: Current medicines are reviewed at length with the patient today.  Concerns regarding medicines are outlined above.  Orders Placed This Encounter  Procedures   CT CORONARY MORPH W/CTA COR W/SCORE W/CA W/CM &/OR WO/CM   Basic Metabolic Panel (BMET)   EKG 12-Lead   ECHOCARDIOGRAM COMPLETE   Meds ordered this encounter  Medications   metoprolol tartrate (LOPRESSOR) 100 MG tablet    Sig: Take 2 hours prior to CT    Dispense:  1 tablet    Refill:  0   Patient Instructions  Medication Instructions:  Your physician recommends that you continue on your current medications as directed. Please refer to the Current Medication list given to you today.  *If you need a refill on your cardiac medications before your next appointment, please call your pharmacy*   Lab Work: Your physician recommends that you have labs drawn today: BMET If you have labs (blood work) drawn today and your tests are completely normal, you will receive your results only by: MyChart Message (if you have MyChart) OR A paper copy in the mail If you have any lab test that is abnormal or we need to change your treatment, we will call you to review the results.   Testing/Procedures: Your physician has requested that you have an echocardiogram.  Echocardiography is a painless test that uses sound waves to create images of your heart. It provides your doctor with information about the size and shape of your heart and how well your heart's chambers and valves are working. This procedure takes approximately one hour. There are no restrictions for this procedure. Please do NOT wear cologne, perfume, aftershave, or lotions (deodorant is allowed). Please arrive 15 minutes prior to your appointment time.    Your cardiac CT will be scheduled at one of the below locations:   [facility] [address] [place], [place] [address] [phone]   If scheduled at [facility], please arrive at the [facility] and Children's Entrance (Entrance C2) of [facility] 30 minutes prior to test start time. You can use the FREE valet parking offered at entrance C (encouraged to control the heart rate for the test)  Proceed to the [facility] Radiology Department (first floor) to check-in and test prep.  All radiology patients and guests should use entrance C2 at [facility], accessed from [facility], even though the hospital's physical address listed is [address].     Please follow these instructions carefully (unless otherwise directed):  An IV will be required for this test and Nitroglycerin will be given.  Hold all erectile dysfunction medications at least 3 days (72 hrs) prior to test. (Ie viagra, cialis, sildenafil, tadalafil, etc)   On the Night Before the Test: Be sure to Drink plenty of water. Do not consume any caffeinated/decaffeinated beverages or chocolate 12 hours prior to your test. Do not take any antihistamines 12 hours prior to your test.  On the Day of the Test: Drink plenty of water until 1 hour prior to the test. Do not eat any food 1 hour prior to test. You may take your regular medications prior to the test.  Take metoprolol (Lopressor) two hours prior to  test. If  you take Furosemide/Hydrochlorothiazide/Spironolactone, please HOLD on the morning of the test. FEMALES- please wear underwire-free bra if available, avoid dresses & tight clothing  After the Test: Drink plenty of water. After receiving IV contrast, you may experience a mild flushed feeling. This is normal. On occasion, you may experience a mild rash up to 24 hours after the test. This is not dangerous. If this occurs, you can take Benadryl 25 mg and increase your fluid intake. If you experience trouble breathing, this can be serious. If it is severe call 911 IMMEDIATELY. If it is mild, please call our office. If you take any of these medications: Glipizide/Metformin, Avandament, Glucavance, please do not take 48 hours after completing test unless otherwise instructed.  We will call to schedule your test 2-4 weeks out understanding that some insurance companies will need an authorization prior to the service being performed.   For more information and frequently asked questions, please visit our website : [email or web address]  For non-scheduling related questions, please contact the cardiac imaging nurse navigator should you have any questions/concerns: Cardiac Imaging Nurse Navigators Direct Office Dial: [phone]   For scheduling needs, including cancellations and rescheduling, please call [place], [phone].    Follow-Up: At [facility], you and your health needs are our priority.  As part of our continuing mission to provide you with exceptional heart care, we have created designated Provider Care Teams.  These Care Teams include your primary Cardiologist (physician) and Advanced Practice Providers (APPs -  Physician Assistants and Nurse Practitioners) who all work together to provide you with the care you need, when you need it.  Your next appointment:    As needed pending test results  Provider:   Reatha Harps, MD    Signed, Lenna Gilford. Flora Lipps, MD,  [facility]  [facility]  [address] [place], [place] [address] [phone]  03/16/2023 4:34 PM

## 2023-03-16 ENCOUNTER — Ambulatory Visit: Payer: BC Managed Care – PPO | Attending: Cardiovascular Disease | Admitting: Cardiovascular Disease

## 2023-03-16 ENCOUNTER — Encounter: Payer: Self-pay | Admitting: Cardiovascular Disease

## 2023-03-16 VITALS — BP 114/78 | HR 67 | Ht 73.0 in | Wt 272.4 lb

## 2023-03-16 DIAGNOSIS — Z01812 Encounter for preprocedural laboratory examination: Secondary | ICD-10-CM

## 2023-03-16 DIAGNOSIS — R072 Precordial pain: Secondary | ICD-10-CM

## 2023-03-16 DIAGNOSIS — R0602 Shortness of breath: Secondary | ICD-10-CM

## 2023-03-16 MED ORDER — METOPROLOL TARTRATE 100 MG PO TABS: ORAL_TABLET | ORAL | 0 refills | Status: AC

## 2023-03-16 NOTE — Patient Instructions (Addendum)
Medication Instructions:  Your physician recommends that you continue on your current medications as directed. Please refer to the Current Medication list given to you today.  *If you need a refill on your cardiac medications before your next appointment, please call your pharmacy*   Lab Work: Your physician recommends that you have labs drawn today: BMET If you have labs (blood work) drawn today and your tests are completely normal, you will receive your results only by: MyChart Message (if you have MyChart) OR A paper copy in the mail If you have any lab test that is abnormal or we need to change your treatment, we will call you to review the results.   Testing/Procedures: Your physician has requested that you have an echocardiogram. Echocardiography is a painless test that uses sound waves to create images of your heart. It provides your doctor with information about the size and shape of your heart and how well your heart's chambers and valves are working. This procedure takes approximately one hour. There are no restrictions for this procedure. Please do NOT wear cologne, perfume, aftershave, or lotions (deodorant is allowed). Please arrive 15 minutes prior to your appointment time.    Your cardiac CT will be scheduled at one of the below locations:   [facility] [address] [place], [place] [address] [phone]   If scheduled at [facility], please arrive at the [facility] and Children's Entrance (Entrance C2) of [facility] 30 minutes prior to test start time. You can use the FREE valet parking offered at entrance C (encouraged to control the heart rate for the test)  Proceed to the [facility] Radiology Department (first floor) to check-in and test prep.  All radiology patients and guests should use entrance C2 at [facility], accessed from [facility], even though the hospital's physical address listed is [address].     Please follow these instructions carefully (unless otherwise directed):  An IV will be required for this test and Nitroglycerin will be given.  Hold all erectile dysfunction medications at least 3 days (72 hrs) prior to test. (Ie viagra, cialis, sildenafil, tadalafil, etc)   On the Night Before the Test: Be sure to Drink plenty of water. Do not consume any caffeinated/decaffeinated beverages or chocolate 12 hours prior to your test. Do not take any antihistamines 12 hours prior to your test.  On the Day of the Test: Drink plenty of water until 1 hour prior to the test. Do not eat any food 1 hour prior to test. You may take your regular medications prior to the test.  Take metoprolol (Lopressor) two hours prior to test. If you take Furosemide/Hydrochlorothiazide/Spironolactone, please HOLD on the morning of the test. FEMALES- please wear underwire-free bra if available, avoid dresses & tight clothing  After the Test: Drink plenty of water. After receiving IV contrast, you may experience a mild flushed feeling. This is normal. On occasion, you may experience a mild rash up to 24 hours after the test. This is not dangerous. If this occurs, you can take Benadryl 25 mg and increase your fluid intake. If you experience trouble breathing, this can be serious. If it is severe call 911 IMMEDIATELY. If it is mild, please call our office. If you take any of these medications: Glipizide/Metformin, Avandament, Glucavance, please do not take 48 hours after completing test unless otherwise instructed.  We will call to schedule your test 2-4 weeks out understanding that some insurance  companies will need an authorization prior to the service being performed.   For more information and frequently asked questions, please visit our website : [email or web address]  For non-scheduling related questions, please contact the cardiac imaging nurse navigator should you have any  questions/concerns: Cardiac Imaging Nurse Navigators Direct Office Dial: [phone]   For scheduling needs, including cancellations and rescheduling, please call [place], [phone].    Follow-Up: At [facility], you and your health needs are our priority.  As part of our continuing mission to provide you with exceptional heart care, we have created designated Provider Care Teams.  These Care Teams include your primary Cardiologist (physician) and Advanced Practice Providers (APPs -  Physician Assistants and Nurse Practitioners) who all work together to provide you with the care you need, when you need it.  Your next appointment:    As needed pending test results  Provider:   Reatha Harps, MD

## 2023-03-22 ENCOUNTER — Ambulatory Visit (HOSPITAL_COMMUNITY)
Admission: RE | Admit: 2023-03-22 | Discharge: 2023-03-22 | Disposition: A | Discharge status: Home / Self Care | Payer: BC Managed Care – PPO | Source: Ambulatory Visit | Attending: Cardiovascular Disease | Admitting: Cardiovascular Disease

## 2023-03-22 DIAGNOSIS — R072 Precordial pain: Secondary | ICD-10-CM | POA: Insufficient documentation

## 2023-03-22 MED ORDER — NITROGLYCERIN 0.4 MG SL SUBL
0.8000 mg | SUBLINGUAL_TABLET | Freq: Once | SUBLINGUAL | Status: AC
  Administered 2023-03-22: 0.8 mg via SUBLINGUAL

## 2023-03-22 MED ORDER — IOHEXOL 350 MG/ML SOLN
100.0000 mL | Freq: Once | INTRAVENOUS | Status: AC | PRN
  Administered 2023-03-22: 100 mL via INTRAVENOUS

## 2023-03-22 MED ORDER — NITROGLYCERIN 0.4 MG SL SUBL
SUBLINGUAL_TABLET | SUBLINGUAL | Status: AC
  Filled 2023-03-22: qty 2

## 2023-03-27 ENCOUNTER — Telehealth: Payer: Self-pay | Admitting: *Deleted

## 2023-03-27 MED ORDER — ROSUVASTATIN CALCIUM 10 MG PO TABS: 10.0000 mg | ORAL_TABLET | Freq: Every day | ORAL | 3 refills | Status: DC

## 2023-03-27 NOTE — Telephone Encounter (Signed)
Patient reviewed via mychart.  RN called left message on voicemail - medication - -10 mg Rosuvastatin  e-sent.   Appointment made for Nov 12,2024 at 1:30 pm with [place],PA   Message to call back if appt is not suitable.

## 2023-03-27 NOTE — Telephone Encounter (Signed)
-----   Message from Reatha Harps sent at 03/22/2023  9:22 PM EDT ----- Nonobstructive CAD.  Total plaque volume 79 mm3 (calcified 5 mm3; noncalcified 74 mm3) which is 18th percentile. TPV is mild.  Would recommend we start him on Crestor 10 mg daily.  Have him see an APP in 3 months.  LDL goal is less than 100.  Does not need an aspirin.  I will send a MyChart message.  He has an echo pending.  Gerri Spore T. Flora Lipps, MD, [facility]  [facility]  [address], [address] [place], [place] [address] [phone]  9:21 PM

## 2023-03-28 ENCOUNTER — Other Ambulatory Visit (HOSPITAL_COMMUNITY): Payer: Self-pay

## 2023-03-28 ENCOUNTER — Telehealth: Payer: Self-pay

## 2023-03-28 DIAGNOSIS — H2511 Age-related nuclear cataract, right eye: Secondary | ICD-10-CM | POA: Diagnosis not present

## 2023-03-28 NOTE — Telephone Encounter (Signed)
Pharmacy Patient Advocate Encounter   Received notification from CoverMyMeds that prior authorization for ROSUVASTATIN  is required/requested.   Insurance verification completed.   The patient is insured through [facility] .   Per test claim: PA required; PA submitted to [employer] [facility] via CoverMyMeds Key/confirmation #/EOC [id number]    Status is pending

## 2023-03-29 ENCOUNTER — Other Ambulatory Visit (HOSPITAL_COMMUNITY): Payer: Self-pay

## 2023-03-29 NOTE — Telephone Encounter (Signed)
Pharmacy Patient Advocate Encounter  Received notification from [employer] [facility] that Prior Authorization for Rosuvastatin has been DENIED. Please advise how you'd like to proceed. Full denial letter will be uploaded to the media tab. See denial reason below.  PER THE THE PTS PLAN: PLAN REQUIRES ANOTHER FORM OF STATIN/HMG-CoA Reductase inhibitor on the lower end of the totem pole of statins be tried before the plan covers Rosuvastatin or Pitavastatin         SEE FULL DENIAL LETTER IN THE PTS MEDIA

## 2023-03-29 NOTE — Telephone Encounter (Signed)
Cash price at [employer] is 5 dolalrs for rosuvastatin 10mg  x 30 tablets. Not sure why his plan will not cover it

## 2023-03-30 DIAGNOSIS — H2511 Age-related nuclear cataract, right eye: Secondary | ICD-10-CM | POA: Diagnosis not present

## 2023-03-31 DIAGNOSIS — H2511 Age-related nuclear cataract, right eye: Secondary | ICD-10-CM | POA: Diagnosis not present

## 2023-04-04 DIAGNOSIS — H35372 Puckering of macula, left eye: Secondary | ICD-10-CM | POA: Diagnosis not present

## 2023-04-06 ENCOUNTER — Ambulatory Visit (HOSPITAL_COMMUNITY): Payer: BC Managed Care – PPO | Attending: Cardiovascular Disease

## 2023-04-06 DIAGNOSIS — R0602 Shortness of breath: Secondary | ICD-10-CM | POA: Diagnosis not present

## 2023-04-06 DIAGNOSIS — R072 Precordial pain: Secondary | ICD-10-CM | POA: Diagnosis not present

## 2023-04-06 LAB — ECHOCARDIOGRAM COMPLETE
Area-P 1/2: 3.27 cm2
S' Lateral: 3.9 cm

## 2023-06-07 DIAGNOSIS — R0981 Nasal congestion: Secondary | ICD-10-CM | POA: Diagnosis not present

## 2023-06-07 DIAGNOSIS — H6692 Otitis media, unspecified, left ear: Secondary | ICD-10-CM | POA: Diagnosis not present

## 2023-06-07 DIAGNOSIS — R059 Cough, unspecified: Secondary | ICD-10-CM | POA: Diagnosis not present

## 2023-06-07 DIAGNOSIS — M25511 Pain in right shoulder: Secondary | ICD-10-CM | POA: Diagnosis not present

## 2023-06-15 NOTE — Progress Notes (Deleted)
Cardiology Office Note:    Date:  06/15/2023   ID:  Mick Sell, DOB March 13, 1961, MRN [id number]  PCP:  Buckner Malta, MD  Cardiologist:  Reatha Harps, MD { Click to update primary MD,subspecialty MD or APP then REFRESH:1}    Referring MD: Buckner Malta, MD   Chief Complaint: follow-up of coronary CTA and Echo  History of Present Illness:    Cory Reynolds is a 62 y.o. male with a history of minimal non-obstructive CAD noted on coronary CTA in 03/2023, mild dilatation of ascending aorta, obstructive sleep apnea, and BPH who is followed by Dr. Flora Lipps and presents today for follow-up of coronary CTA and Echo.  Patient was referred to Dr. Flora Lipps in 03/2023 for further evaluation of shortness of breath. He reported chest discomfort and shortness of breath for the last 3 months. Chest pain occurred with activity and resolved with rest. Coronary CTA and Echo were ordered for further evaluation. Coronary CTA showed a coronary calcium score of 28.5 (44th percentile for age and sex) and minimal non-obstructive CAD as well as dilated main pulmonary artery suggestive of pulmonary hypertension. Echo showed LVEF of 60-65% with normal wall motion and mild LVH, normal RV size and function, and no significant valvular disease. Also showed dilatation of the aortic root and ascending aorta measuring 37mm and 40mm respectively. He was started on a statin given mild CAD.  Patient presents today for follow-up. ***  Minimal Non-Obstructive CAD Recent coronary CTA in 03/2023 showed a coronary calcium score of 28.5 (44th percentile for age and sex) and minimal non-obstructive CAD. Echo showed LVEF of 60-65% with mild LVH.  - *** - No need for Aspirin right now given only minimal disease.  - Continue Crestor 10mg  daily. This was started after coronary CTA. Will repeat lipid panel and LFTs. ***  Mild Dilatation of Ascending Aorta Recent Echo in 03/2023 showed dilatation of the aortic root and ascending  aorta measuring 37mm and 40mm respectively. - Can plan to repeat Echo in 03/2024 for routine monitoring of this.   Obstructive Sleep Apnea ***   EKGs/Labs/Other Studies Reviewed:    The following studies were reviewed:  Coronary CTA 03/22/2023: Impressions: 1. Minimal mixed non-obstructive CAD, CADRADS = 1. 2. Coronary artery calcium score is 28.5, which places the patient in the 44th percentile for age/race and sex-matched control. 3. Normal coronary origin with right dominance. 4. Dilated main pulmonary artery to 32 mm, suggestive of pulmonary hypertension. _______________  Echocardiogram 04/06/2023: Impressions: 1. Left ventricular ejection fraction, by estimation, is 60 to 65%. The  left ventricle has normal function. The left ventricle has no regional  wall motion abnormalities. There is mild concentric left ventricular  hypertrophy. Left ventricular diastolic  parameters are indeterminate.   2. Right ventricular systolic function is normal. The right ventricular  size is normal.   3. The mitral valve is normal in structure. No evidence of mitral valve  regurgitation. No evidence of mitral stenosis.   4. The aortic valve is normal in structure. Aortic valve regurgitation is  not visualized. No aortic stenosis is present.   5. There is dilatation of the aortic root, measuring 37 mm. There is  dilatation of the ascending aorta, measuring 40 mm.   6. The inferior vena cava is normal in size with greater than 50%  respiratory variability, suggesting right atrial pressure of 3 mmHg.   EKG:  EKG  not ordered today.   Recent Labs: 03/16/2023: BUN 29; Creatinine, Ser 1.03; Potassium 4.8;  Sodium 144  Recent Lipid Panel    Component Value Date/Time   CHOL 193 03/05/2019 1626   TRIG 106 03/05/2019 1626   HDL 41 03/05/2019 1626   CHOLHDL 4.7 03/05/2019 1626   VLDL 24 03/04/2016 0809   LDLCALC 131 (H) 03/05/2019 1626    Physical Exam:    Vital Signs: There were no vitals  taken for this visit.    Wt Readings from Last 3 Encounters:  03/16/23 272 lb 6.4 oz (123.6 kg)  04/09/19 221 lb 4 oz (100.4 kg)  02/22/19 222 lb (100.7 kg)     General: 62 y.o. male in no acute distress. HEENT: Normocephalic and atraumatic. Sclera clear.  Neck: Supple. No carotid bruits. No JVD. Heart: *** RRR. Distinct S1 and S2. No murmurs, gallops, or rubs.  Lungs: No increased work of breathing. Clear to ausculation bilaterally. No wheezes, rhonchi, or rales.  Abdomen: Soft, non-distended, and non-tender to palpation.  Extremities: No lower extremity edema.  Radial and distal pedal pulses 2+ and equal bilaterally. Skin: Warm and dry. Neuro: No focal deficits. Psych: Normal affect. Responds appropriately.   Assessment:    No diagnosis found.  Plan:     Disposition: Follow up in ***   Signed, Corrin Parker, PA-C  06/15/2023 3:10 PM    [facility]

## 2023-06-27 ENCOUNTER — Ambulatory Visit: Payer: 59 | Attending: Student | Admitting: Student

## 2024-04-18 ENCOUNTER — Other Ambulatory Visit: Payer: Self-pay | Admitting: Cardiovascular Disease

## 2024-05-14 ENCOUNTER — Other Ambulatory Visit: Payer: Self-pay | Admitting: Cardiovascular Disease

## 2024-05-14 NOTE — Telephone Encounter (Signed)
 Please advise patient to make yearly appointment with cardiologist for future refills. Thank you and 2nd attempt.

## 2024-06-11 ENCOUNTER — Other Ambulatory Visit: Payer: Self-pay | Admitting: Cardiovascular Disease

## 2024-06-21 ENCOUNTER — Other Ambulatory Visit: Payer: Self-pay | Admitting: Cardiovascular Disease

## 2024-06-25 ENCOUNTER — Other Ambulatory Visit: Payer: Self-pay | Admitting: Cardiovascular Disease

## 2024-07-30 ENCOUNTER — Other Ambulatory Visit: Payer: Self-pay | Admitting: Cardiovascular Disease
